# Patient Record
Sex: Male | Born: 1995 | Race: White | Hispanic: No | Marital: Single | Smoking: Former smoker
Health system: Southern US, Community
[De-identification: ages and names within clinical notes are randomized; demographics above are authoritative.]

## PROBLEM LIST (undated history)

## (undated) DIAGNOSIS — J45909 Unspecified asthma, uncomplicated: Secondary | ICD-10-CM

## (undated) DIAGNOSIS — L089 Local infection of the skin and subcutaneous tissue, unspecified: Secondary | ICD-10-CM

## (undated) DIAGNOSIS — J189 Pneumonia, unspecified organism: Secondary | ICD-10-CM

## (undated) DIAGNOSIS — T148XXA Other injury of unspecified body region, initial encounter: Secondary | ICD-10-CM

---

## 2015-11-10 ENCOUNTER — Emergency Department (HOSPITAL_COMMUNITY): Payer: 59

## 2015-11-10 ENCOUNTER — Inpatient Hospital Stay (HOSPITAL_COMMUNITY): Payer: 59

## 2015-11-10 ENCOUNTER — Inpatient Hospital Stay (HOSPITAL_COMMUNITY)
Admission: EM | Admit: 2015-11-10 | Discharge: 2015-11-21 | DRG: 908 | Disposition: A | Discharge status: Home / Self Care | Payer: 59 | Attending: Orthopedic Surgery | Admitting: Orthopedic Surgery

## 2015-11-10 ENCOUNTER — Encounter (HOSPITAL_COMMUNITY): Payer: Self-pay | Admitting: *Deleted

## 2015-11-10 ENCOUNTER — Emergency Department (HOSPITAL_COMMUNITY): Payer: 59 | Admitting: Anesthesiology

## 2015-11-10 ENCOUNTER — Encounter (HOSPITAL_COMMUNITY)
Admission: EM | Disposition: A | Discharge status: Home / Self Care | Payer: Self-pay | Source: Home / Self Care | Attending: Orthopedic Surgery

## 2015-11-10 DIAGNOSIS — S98322A Partial traumatic amputation of left midfoot, initial encounter: Principal | ICD-10-CM | POA: Diagnosis present

## 2015-11-10 DIAGNOSIS — S88112A Complete traumatic amputation at level between knee and ankle, left lower leg, initial encounter: Secondary | ICD-10-CM | POA: Diagnosis present

## 2015-11-10 DIAGNOSIS — F17200 Nicotine dependence, unspecified, uncomplicated: Secondary | ICD-10-CM | POA: Diagnosis present

## 2015-11-10 DIAGNOSIS — J45909 Unspecified asthma, uncomplicated: Secondary | ICD-10-CM | POA: Diagnosis present

## 2015-11-10 DIAGNOSIS — F10929 Alcohol use, unspecified with intoxication, unspecified: Secondary | ICD-10-CM | POA: Diagnosis present

## 2015-11-10 DIAGNOSIS — D62 Acute posthemorrhagic anemia: Secondary | ICD-10-CM | POA: Diagnosis not present

## 2015-11-10 DIAGNOSIS — T1490XA Injury, unspecified, initial encounter: Secondary | ICD-10-CM

## 2015-11-10 DIAGNOSIS — Z89512 Acquired absence of left leg below knee: Secondary | ICD-10-CM | POA: Diagnosis not present

## 2015-11-10 DIAGNOSIS — Y908 Blood alcohol level of 240 mg/100 ml or more: Secondary | ICD-10-CM | POA: Diagnosis present

## 2015-11-10 DIAGNOSIS — G8918 Other acute postprocedural pain: Secondary | ICD-10-CM

## 2015-11-10 DIAGNOSIS — S88112S Complete traumatic amputation at level between knee and ankle, left lower leg, sequela: Secondary | ICD-10-CM | POA: Diagnosis not present

## 2015-11-10 DIAGNOSIS — S98132A Complete traumatic amputation of one left lesser toe, initial encounter: Secondary | ICD-10-CM

## 2015-11-10 DIAGNOSIS — Z89519 Acquired absence of unspecified leg below knee: Secondary | ICD-10-CM | POA: Diagnosis present

## 2015-11-10 DIAGNOSIS — S91302A Unspecified open wound, left foot, initial encounter: Secondary | ICD-10-CM

## 2015-11-10 DIAGNOSIS — Z23 Encounter for immunization: Secondary | ICD-10-CM | POA: Diagnosis not present

## 2015-11-10 DIAGNOSIS — S9782XS Crushing injury of left foot, sequela: Secondary | ICD-10-CM | POA: Diagnosis not present

## 2015-11-10 DIAGNOSIS — R509 Fever, unspecified: Secondary | ICD-10-CM | POA: Diagnosis present

## 2015-11-10 DIAGNOSIS — F419 Anxiety disorder, unspecified: Secondary | ICD-10-CM | POA: Diagnosis not present

## 2015-11-10 DIAGNOSIS — L089 Local infection of the skin and subcutaneous tissue, unspecified: Secondary | ICD-10-CM | POA: Diagnosis present

## 2015-11-10 DIAGNOSIS — S9780XA Crushing injury of unspecified foot, initial encounter: Secondary | ICD-10-CM | POA: Diagnosis present

## 2015-11-10 DIAGNOSIS — S9782XA Crushing injury of left foot, initial encounter: Secondary | ICD-10-CM | POA: Diagnosis present

## 2015-11-10 DIAGNOSIS — F10129 Alcohol abuse with intoxication, unspecified: Secondary | ICD-10-CM | POA: Diagnosis present

## 2015-11-10 DIAGNOSIS — Z419 Encounter for procedure for purposes other than remedying health state, unspecified: Secondary | ICD-10-CM

## 2015-11-10 HISTORY — PX: AMPUTATION: SHX166

## 2015-11-10 HISTORY — PX: I & D EXTREMITY: SHX5045

## 2015-11-10 HISTORY — DX: Unspecified asthma, uncomplicated: J45.909

## 2015-11-10 LAB — RAPID URINE DRUG SCREEN, HOSP PERFORMED
AMPHETAMINES: NOT DETECTED
BARBITURATES: NOT DETECTED
BENZODIAZEPINES: NOT DETECTED
Cocaine: NOT DETECTED
Opiates: POSITIVE — AB
Tetrahydrocannabinol: NOT DETECTED

## 2015-11-10 LAB — COMPREHENSIVE METABOLIC PANEL
ALBUMIN: 4 g/dL (ref 3.5–5.0)
ALK PHOS: 56 U/L (ref 38–126)
ALT: 18 U/L (ref 17–63)
ANION GAP: 8 (ref 5–15)
AST: 25 U/L (ref 15–41)
BILIRUBIN TOTAL: 0.7 mg/dL (ref 0.3–1.2)
BUN: 8 mg/dL (ref 6–20)
CALCIUM: 7.9 mg/dL — AB (ref 8.9–10.3)
CO2: 22 mmol/L (ref 22–32)
Chloride: 111 mmol/L (ref 101–111)
Creatinine, Ser: 0.93 mg/dL (ref 0.61–1.24)
GLUCOSE: 108 mg/dL — AB (ref 65–99)
Potassium: 3.5 mmol/L (ref 3.5–5.1)
Sodium: 141 mmol/L (ref 135–145)
TOTAL PROTEIN: 5.9 g/dL — AB (ref 6.5–8.1)

## 2015-11-10 LAB — CBC
HCT: 40.2 % (ref 39.0–52.0)
HEMATOCRIT: 36.4 % — AB (ref 39.0–52.0)
HEMOGLOBIN: 13.4 g/dL (ref 13.0–17.0)
HEMOGLOBIN: 12 g/dL — AB (ref 13.0–17.0)
MCH: 30 pg (ref 26.0–34.0)
MCH: 29.7 pg (ref 26.0–34.0)
MCHC: 33.3 g/dL (ref 30.0–36.0)
MCHC: 33 g/dL (ref 30.0–36.0)
MCV: 90.1 fL (ref 78.0–100.0)
MCV: 90.1 fL (ref 78.0–100.0)
Platelets: 289 10*3/uL (ref 150–400)
Platelets: 225 10*3/uL (ref 150–400)
RBC: 4.46 MIL/uL (ref 4.22–5.81)
RBC: 4.04 MIL/uL — AB (ref 4.22–5.81)
RDW: 13.3 % (ref 11.5–15.5)
RDW: 13 % (ref 11.5–15.5)
WBC: 10 10*3/uL (ref 4.0–10.5)
WBC: 8.5 10*3/uL (ref 4.0–10.5)

## 2015-11-10 LAB — URINALYSIS, ROUTINE W REFLEX MICROSCOPIC
BILIRUBIN URINE: NEGATIVE
Glucose, UA: NEGATIVE mg/dL
HGB URINE DIPSTICK: NEGATIVE
Ketones, ur: 15 mg/dL — AB
Leukocytes, UA: NEGATIVE
Nitrite: NEGATIVE
PROTEIN: NEGATIVE mg/dL
Specific Gravity, Urine: 1.014 (ref 1.005–1.030)
pH: 7 (ref 5.0–8.0)

## 2015-11-10 LAB — TYPE AND SCREEN
ABO/RH(D): O POS
ANTIBODY SCREEN: NEGATIVE

## 2015-11-10 LAB — ABO/RH: ABO/RH(D): O POS

## 2015-11-10 LAB — PROTIME-INR
INR: 1.16 (ref 0.00–1.49)
PROTHROMBIN TIME: 15 s (ref 11.6–15.2)

## 2015-11-10 LAB — ETHANOL: ALCOHOL ETHYL (B): 278 mg/dL — AB (ref <5)

## 2015-11-10 SURGERY — IRRIGATION AND DEBRIDEMENT EXTREMITY
Anesthesia: General | Site: Foot | Laterality: Left

## 2015-11-10 MED ORDER — OXYCODONE HCL 5 MG PO TABS
5.0000 mg | ORAL_TABLET | ORAL | Status: DC | PRN
  Administered 2015-11-10 – 2015-11-14 (×19): 15 mg via ORAL
  Administered 2015-11-14: 10 mg via ORAL
  Administered 2015-11-14 – 2015-11-15 (×4): 15 mg via ORAL
  Administered 2015-11-15 (×2): 10 mg via ORAL
  Administered 2015-11-15 – 2015-11-16 (×4): 15 mg via ORAL
  Filled 2015-11-10: qty 3
  Filled 2015-11-10: qty 2
  Filled 2015-11-10 (×8): qty 3
  Filled 2015-11-10: qty 2
  Filled 2015-11-10 (×20): qty 3

## 2015-11-10 MED ORDER — METHOCARBAMOL 1000 MG/10ML IJ SOLN: 1000.0000 mg | Freq: Four times a day (QID) | INTRAVENOUS | Status: DC

## 2015-11-10 MED ORDER — HYDROMORPHONE HCL 1 MG/ML IJ SOLN
0.5000 mg | INTRAMUSCULAR | Status: DC | PRN
  Administered 2015-11-10 – 2015-11-13 (×20): 1 mg via INTRAVENOUS
  Filled 2015-11-10 (×21): qty 1

## 2015-11-10 MED ORDER — CEFAZOLIN SODIUM-DEXTROSE 2-3 GM-% IV SOLR
2.0000 g | Freq: Three times a day (TID) | INTRAVENOUS | Status: DC
  Administered 2015-11-10: 2 g via INTRAVENOUS
  Filled 2015-11-10 (×4): qty 50

## 2015-11-10 MED ORDER — CEFAZOLIN SODIUM-DEXTROSE 2-3 GM-% IV SOLR
2.0000 g | Freq: Once | INTRAVENOUS | Status: AC
  Administered 2015-11-10: 2 g via INTRAVENOUS

## 2015-11-10 MED ORDER — PREGABALIN 75 MG PO CAPS
75.0000 mg | ORAL_CAPSULE | Freq: Two times a day (BID) | ORAL | Status: DC
  Administered 2015-11-10 – 2015-11-12 (×4): 75 mg via ORAL
  Filled 2015-11-10 (×5): qty 1

## 2015-11-10 MED ORDER — SUCCINYLCHOLINE 20MG/ML (10ML) SYRINGE FOR MEDFUSION PUMP - OPTIME
INTRAMUSCULAR | Status: DC | PRN
  Administered 2015-11-10: 100 mg via INTRAVENOUS

## 2015-11-10 MED ORDER — SODIUM CHLORIDE 0.9 % IR SOLN
Status: DC | PRN
  Administered 2015-11-10: 3000 mL

## 2015-11-10 MED ORDER — FENTANYL CITRATE (PF) 250 MCG/5ML IJ SOLN
INTRAMUSCULAR | Status: AC
  Filled 2015-11-10: qty 5

## 2015-11-10 MED ORDER — HYDROMORPHONE HCL 1 MG/ML IJ SOLN
0.2500 mg | INTRAMUSCULAR | Status: DC | PRN
  Administered 2015-11-10 (×2): 0.5 mg via INTRAVENOUS

## 2015-11-10 MED ORDER — PROPOFOL 10 MG/ML IV BOLUS
INTRAVENOUS | Status: DC | PRN
  Administered 2015-11-10: 200 mg via INTRAVENOUS

## 2015-11-10 MED ORDER — TETANUS-DIPHTH-ACELL PERTUSSIS 5-2.5-18.5 LF-MCG/0.5 IM SUSP
INTRAMUSCULAR | Status: AC
  Filled 2015-11-10: qty 0.5

## 2015-11-10 MED ORDER — SODIUM CHLORIDE 0.9 % IV SOLN: INTRAVENOUS | Status: DC

## 2015-11-10 MED ORDER — OXYCODONE HCL 5 MG PO TABS
5.0000 mg | ORAL_TABLET | ORAL | Status: DC | PRN
  Administered 2015-11-10 (×2): 5 mg via ORAL
  Filled 2015-11-10 (×2): qty 1

## 2015-11-10 MED ORDER — ENOXAPARIN SODIUM 40 MG/0.4ML ~~LOC~~ SOLN
40.0000 mg | SUBCUTANEOUS | Status: DC
  Administered 2015-11-10 – 2015-11-20 (×11): 40 mg via SUBCUTANEOUS
  Filled 2015-11-10 (×11): qty 0.4

## 2015-11-10 MED ORDER — SODIUM CHLORIDE 0.9 % IV SOLN
INTRAVENOUS | Status: DC
  Administered 2015-11-10 – 2015-11-11 (×5): via INTRAVENOUS
  Administered 2015-11-11: 125 mL/h via INTRAVENOUS

## 2015-11-10 MED ORDER — GENTAMICIN SULFATE 40 MG/ML IJ SOLN
500.0000 mg | INTRAVENOUS | Status: DC
  Administered 2015-11-10 – 2015-11-15 (×5): 500 mg via INTRAVENOUS
  Filled 2015-11-10 (×7): qty 12.5

## 2015-11-10 MED ORDER — DOCUSATE SODIUM 100 MG PO CAPS
100.0000 mg | ORAL_CAPSULE | Freq: Two times a day (BID) | ORAL | Status: DC
  Administered 2015-11-10 – 2015-11-21 (×21): 100 mg via ORAL
  Filled 2015-11-10 (×22): qty 1

## 2015-11-10 MED ORDER — OXYCODONE-ACETAMINOPHEN 5-325 MG PO TABS
1.0000 | ORAL_TABLET | Freq: Four times a day (QID) | ORAL | Status: DC | PRN
  Administered 2015-11-10 – 2015-11-13 (×8): 2 via ORAL
  Filled 2015-11-10 (×8): qty 2

## 2015-11-10 MED ORDER — BISACODYL 5 MG PO TBEC
5.0000 mg | DELAYED_RELEASE_TABLET | Freq: Every day | ORAL | Status: DC | PRN
  Filled 2015-11-10: qty 1

## 2015-11-10 MED ORDER — MORPHINE SULFATE (PF) 2 MG/ML IV SOLN
1.0000 mg | INTRAVENOUS | Status: DC | PRN
  Administered 2015-11-10: 2 mg via INTRAVENOUS
  Filled 2015-11-10: qty 1

## 2015-11-10 MED ORDER — ONDANSETRON HCL 4 MG/2ML IJ SOLN
INTRAMUSCULAR | Status: DC | PRN
  Administered 2015-11-10: 4 mg via INTRAVENOUS

## 2015-11-10 MED ORDER — MORPHINE SULFATE (PF) 2 MG/ML IV SOLN
0.5000 mg | INTRAVENOUS | Status: DC | PRN
  Administered 2015-11-10: 0.5 mg via INTRAVENOUS
  Filled 2015-11-10: qty 1

## 2015-11-10 MED ORDER — HYDROMORPHONE HCL 1 MG/ML IJ SOLN
INTRAMUSCULAR | Status: AC
  Filled 2015-11-10: qty 1

## 2015-11-10 MED ORDER — PANTOPRAZOLE SODIUM 40 MG PO TBEC
40.0000 mg | DELAYED_RELEASE_TABLET | Freq: Every day | ORAL | Status: DC
  Administered 2015-11-10 – 2015-11-21 (×10): 40 mg via ORAL
  Filled 2015-11-10 (×11): qty 1

## 2015-11-10 MED ORDER — ONDANSETRON HCL 4 MG PO TABS
4.0000 mg | ORAL_TABLET | Freq: Four times a day (QID) | ORAL | Status: DC | PRN
  Administered 2015-11-17: 8 mg via ORAL
  Administered 2015-11-18: 4 mg via ORAL
  Filled 2015-11-10: qty 2
  Filled 2015-11-10: qty 1

## 2015-11-10 MED ORDER — CEFAZOLIN SODIUM-DEXTROSE 2-3 GM-% IV SOLR
2.0000 g | Freq: Three times a day (TID) | INTRAVENOUS | Status: AC
  Administered 2015-11-10 – 2015-11-15 (×15): 2 g via INTRAVENOUS
  Filled 2015-11-10 (×19): qty 50

## 2015-11-10 MED ORDER — LIDOCAINE HCL (CARDIAC) 20 MG/ML IV SOLN
INTRAVENOUS | Status: AC
  Filled 2015-11-10: qty 5

## 2015-11-10 MED ORDER — FENTANYL CITRATE (PF) 100 MCG/2ML IJ SOLN
100.0000 ug | Freq: Once | INTRAMUSCULAR | Status: AC
Start: 2015-11-10 — End: 2015-11-10
  Administered 2015-11-10: 100 ug via INTRAVENOUS

## 2015-11-10 MED ORDER — ONDANSETRON HCL 4 MG/2ML IJ SOLN
4.0000 mg | Freq: Once | INTRAMUSCULAR | Status: AC
  Administered 2015-11-10: 4 mg via INTRAVENOUS

## 2015-11-10 MED ORDER — MAGNESIUM HYDROXIDE 400 MG/5ML PO SUSP
30.0000 mL | Freq: Every day | ORAL | Status: DC | PRN
  Administered 2015-11-15 – 2015-11-19 (×3): 30 mL via ORAL
  Filled 2015-11-10 (×3): qty 30

## 2015-11-10 MED ORDER — TETANUS-DIPHTH-ACELL PERTUSSIS 5-2.5-18.5 LF-MCG/0.5 IM SUSP
0.5000 mL | Freq: Once | INTRAMUSCULAR | Status: AC
  Administered 2015-11-10: 0.5 mL via INTRAMUSCULAR

## 2015-11-10 MED ORDER — ONDANSETRON HCL 4 MG/2ML IJ SOLN
4.0000 mg | Freq: Four times a day (QID) | INTRAMUSCULAR | Status: DC | PRN
  Administered 2015-11-10 – 2015-11-13 (×2): 8 mg via INTRAVENOUS
  Filled 2015-11-10: qty 4

## 2015-11-10 MED ORDER — METHOCARBAMOL 500 MG PO TABS
1000.0000 mg | ORAL_TABLET | Freq: Four times a day (QID) | ORAL | Status: DC
  Administered 2015-11-10 – 2015-11-18 (×30): 1000 mg via ORAL
  Filled 2015-11-10 (×32): qty 2

## 2015-11-10 MED ORDER — PROPOFOL 10 MG/ML IV BOLUS
INTRAVENOUS | Status: AC
  Filled 2015-11-10: qty 20

## 2015-11-10 MED ORDER — ONDANSETRON HCL 4 MG/2ML IJ SOLN
INTRAMUSCULAR | Status: AC
  Filled 2015-11-10: qty 4

## 2015-11-10 MED ORDER — LIDOCAINE HCL (CARDIAC) 20 MG/ML IV SOLN
INTRAVENOUS | Status: DC | PRN
  Administered 2015-11-10: 100 mg via INTRAVENOUS

## 2015-11-10 MED ORDER — FLEET ENEMA 7-19 GM/118ML RE ENEM: 1.0000 | ENEMA | Freq: Once | RECTAL | Status: DC | PRN

## 2015-11-10 MED ORDER — LACTATED RINGERS IV SOLN
INTRAVENOUS | Status: DC | PRN
  Administered 2015-11-10: 03:00:00 via INTRAVENOUS

## 2015-11-10 MED ORDER — METHOCARBAMOL 500 MG PO TABS: 500.0000 mg | ORAL_TABLET | Freq: Four times a day (QID) | ORAL | Status: DC | PRN

## 2015-11-10 MED ORDER — PROMETHAZINE HCL 25 MG/ML IJ SOLN: 6.2500 mg | INTRAMUSCULAR | Status: DC | PRN

## 2015-11-10 MED ORDER — SODIUM CHLORIDE 0.9 % IV SOLN
108.9000 mg | INTRAVENOUS | Status: DC | PRN
  Administered 2015-11-10: 80 mg via INTRAVENOUS

## 2015-11-10 MED ORDER — OXYCODONE HCL 5 MG/5ML PO SOLN: 5.0000 mg | Freq: Once | ORAL | Status: DC | PRN

## 2015-11-10 MED ORDER — METHOCARBAMOL 1000 MG/10ML IJ SOLN
500.0000 mg | Freq: Four times a day (QID) | INTRAVENOUS | Status: DC | PRN
  Filled 2015-11-10: qty 5

## 2015-11-10 MED ORDER — OXYCODONE HCL 5 MG PO TABS: 10.0000 mg | ORAL_TABLET | Freq: Once | ORAL | Status: DC | PRN

## 2015-11-10 MED ORDER — OXYCODONE HCL 5 MG PO TABS: 5.0000 mg | ORAL_TABLET | ORAL | Status: DC | PRN

## 2015-11-10 MED ORDER — FENTANYL CITRATE (PF) 100 MCG/2ML IJ SOLN
INTRAMUSCULAR | Status: DC | PRN
  Administered 2015-11-10 (×2): 100 ug via INTRAVENOUS

## 2015-11-10 MED ORDER — SUCCINYLCHOLINE CHLORIDE 20 MG/ML IJ SOLN
INTRAMUSCULAR | Status: AC
  Filled 2015-11-10: qty 1

## 2015-11-10 MED ORDER — METHOCARBAMOL 500 MG PO TABS
1000.0000 mg | ORAL_TABLET | Freq: Four times a day (QID) | ORAL | Status: DC | PRN
  Administered 2015-11-10: 1000 mg via ORAL
  Filled 2015-11-10: qty 2

## 2015-11-10 MED ORDER — METHOCARBAMOL 1000 MG/10ML IJ SOLN
1000.0000 mg | Freq: Four times a day (QID) | INTRAVENOUS | Status: DC | PRN
  Filled 2015-11-10: qty 10

## 2015-11-10 MED ORDER — HYDROCODONE-ACETAMINOPHEN 5-325 MG PO TABS
1.0000 | ORAL_TABLET | Freq: Four times a day (QID) | ORAL | Status: DC | PRN
  Administered 2015-11-10: 2 via ORAL
  Filled 2015-11-10: qty 2

## 2015-11-10 SURGICAL SUPPLY — 52 items
BANDAGE ELASTIC 4 VELCRO ST LF (GAUZE/BANDAGES/DRESSINGS) ×3 IMPLANT
BANDAGE ELASTIC 6 VELCRO ST LF (GAUZE/BANDAGES/DRESSINGS) ×3 IMPLANT
BLADE SURG 10 STRL SS (BLADE) ×6 IMPLANT
BNDG COHESIVE 4X5 TAN STRL (GAUZE/BANDAGES/DRESSINGS) ×3 IMPLANT
BNDG GAUZE ELAST 4 BULKY (GAUZE/BANDAGES/DRESSINGS) ×3 IMPLANT
BNDG GAUZE STRTCH 6 (GAUZE/BANDAGES/DRESSINGS) ×9 IMPLANT
BRUSH SCRUB DISP (MISCELLANEOUS) ×6 IMPLANT
CANISTER WOUND CARE 500ML ATS (WOUND CARE) ×3 IMPLANT
COTTON STERILE ROLL (GAUZE/BANDAGES/DRESSINGS) ×3 IMPLANT
COVER SURGICAL LIGHT HANDLE (MISCELLANEOUS) ×6 IMPLANT
DRAPE U-SHAPE 47X51 STRL (DRAPES) ×3 IMPLANT
DRSG ADAPTIC 3X8 NADH LF (GAUZE/BANDAGES/DRESSINGS) ×3 IMPLANT
DRSG VAC ATS MED SENSATRAC (GAUZE/BANDAGES/DRESSINGS) ×3 IMPLANT
ELECT CAUTERY BLADE 6.4 (BLADE) ×3 IMPLANT
ELECT REM PT RETURN 9FT ADLT (ELECTROSURGICAL)
ELECTRODE REM PT RTRN 9FT ADLT (ELECTROSURGICAL) IMPLANT
GAUZE SPONGE 4X4 12PLY STRL (GAUZE/BANDAGES/DRESSINGS) ×3 IMPLANT
GLOVE BIO SURGEON STRL SZ7 (GLOVE) ×3 IMPLANT
GLOVE BIO SURGEON STRL SZ7.5 (GLOVE) ×9 IMPLANT
GLOVE BIO SURGEON STRL SZ8 (GLOVE) ×3 IMPLANT
GLOVE BIOGEL PI IND STRL 6.5 (GLOVE) ×1 IMPLANT
GLOVE BIOGEL PI IND STRL 7.5 (GLOVE) ×1 IMPLANT
GLOVE BIOGEL PI IND STRL 8 (GLOVE) ×2 IMPLANT
GLOVE BIOGEL PI INDICATOR 6.5 (GLOVE) ×2
GLOVE BIOGEL PI INDICATOR 7.5 (GLOVE) ×2
GLOVE BIOGEL PI INDICATOR 8 (GLOVE) ×4
GOWN STRL REUS W/ TWL LRG LVL3 (GOWN DISPOSABLE) ×2 IMPLANT
GOWN STRL REUS W/ TWL XL LVL3 (GOWN DISPOSABLE) ×1 IMPLANT
GOWN STRL REUS W/TWL LRG LVL3 (GOWN DISPOSABLE) ×4
GOWN STRL REUS W/TWL XL LVL3 (GOWN DISPOSABLE) ×2
HANDPIECE INTERPULSE COAX TIP (DISPOSABLE)
KIT BASIN OR (CUSTOM PROCEDURE TRAY) ×3 IMPLANT
KIT ROOM TURNOVER OR (KITS) ×3 IMPLANT
MANIFOLD NEPTUNE II (INSTRUMENTS) ×3 IMPLANT
NS IRRIG 1000ML POUR BTL (IV SOLUTION) ×3 IMPLANT
PACK ORTHO EXTREMITY (CUSTOM PROCEDURE TRAY) ×3 IMPLANT
PAD ARMBOARD 7.5X6 YLW CONV (MISCELLANEOUS) ×6 IMPLANT
PADDING CAST COTTON 6X4 STRL (CAST SUPPLIES) ×3 IMPLANT
SET HNDPC FAN SPRY TIP SCT (DISPOSABLE) IMPLANT
SPONGE LAP 18X18 X RAY DECT (DISPOSABLE) ×3 IMPLANT
STOCKINETTE IMPERVIOUS 9X36 MD (GAUZE/BANDAGES/DRESSINGS) ×3 IMPLANT
SUT ETHILON 2 0 PSLX (SUTURE) ×3 IMPLANT
SUT ETHILON 3 0 PS 1 (SUTURE) ×3 IMPLANT
SUT PDS AB 2-0 CT1 27 (SUTURE) IMPLANT
TOWEL OR 17X24 6PK STRL BLUE (TOWEL DISPOSABLE) ×3 IMPLANT
TOWEL OR 17X26 10 PK STRL BLUE (TOWEL DISPOSABLE) ×6 IMPLANT
TUBE ANAEROBIC SPECIMEN COL (MISCELLANEOUS) IMPLANT
TUBE CONNECTING 12'X1/4 (SUCTIONS) ×2
TUBE CONNECTING 12X1/4 (SUCTIONS) ×4 IMPLANT
UNDERPAD 30X30 INCONTINENT (UNDERPADS AND DIAPERS) ×3 IMPLANT
WATER STERILE IRR 1000ML POUR (IV SOLUTION) ×3 IMPLANT
YANKAUER SUCT BULB TIP NO VENT (SUCTIONS) ×3 IMPLANT

## 2015-11-10 NOTE — Anesthesia Preprocedure Evaluation (Signed)
Anesthesia Evaluation  Patient identified by MRN, date of birth, ID band Patient awake    Reviewed: Allergy & Precautions, H&P , NPO status , Patient's Chart, lab work & pertinent test results  History of Anesthesia Complications Negative for: history of anesthetic complications  Airway Mallampati: II  TM Distance: >3 FB Neck ROM: full    Dental no notable dental hx. 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,   Endo/Other  negative endocrine ROS  Renal/GU negative Renal ROS     Musculoskeletal   Abdominal   Peds  Hematology negative hematology ROS (+)   Anesthesia Other Findings + alcohol, he is able to answer questions, negative anesthesia history elicited, in C collar, vital signs stable with no difficulty breathing, bilateral breath sounds  Left lower leg wrapped in bandage  Reproductive/Obstetrics negative OB ROS                             Anesthesia Physical Anesthesia Plan  ASA: II and emergent  Anesthesia Plan: General   Post-op Pain Management:    Induction: Intravenous and Rapid sequence  Airway Management Planned: Oral ETT  Additional Equipment:   Intra-op Plan:   Post-operative Plan: Extubation in OR  Informed Consent: I have reviewed the patients History and Physical, chart, labs and discussed the procedure including the risks, benefits and alternatives for the proposed anesthesia with the patient or authorized representative who has indicated his/her understanding and acceptance.   Dental Advisory Given  Plan Discussed with: Anesthesiologist and CRNA  Anesthesia  Plan Comments: (Will provide RSI given trauma situation with unknown reliable last NPO In line stabilization for the neck will be provided given C collar and inability to clear spine due to patient inebriation)        Anesthesia Quick Evaluation
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
## 2015-11-10 NOTE — ED Notes (Signed)
zofran  4mg  and fentanyl 100mcg given iv by kim r n
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
## 2015-11-10 NOTE — ED Notes (Signed)
Aspen collar placed.  Pt going to the or as soon as his c-ts are done

## 2015-11-10 NOTE — Progress Notes (Signed)
   11/10/15 0123  Clinical Encounter Type  Visited With Patient  Visit Type ED  Referral From Nurse  Spiritual Encounters  Spiritual Needs Emotional  Stress Factors  Patient Stress Factors Health changes;Lack of knowledge;Family relationships  Advance Directives (For Healthcare)  Does patient have an advance directive? No  Would patient like information on creating an advanced directive? No - patient declined information  Paged by ED for Tr Lev 1, downgraded to 2. Visited with patient, spoke to doctor. Doctor had talked to father in [place], and he was on his way. Large group of friends from [employer] in waiting room, one of whom tried to convince chaplain he was patient's brother. Chaplain confirmed he had no brother at [employer]. Chaplain advised friends (and Diplomatic Services operational officersecretary) that they could not come back. Chaplain spoke briefly to patient to offer encouragement before he went for testing. Instructed ED staff to page me if parents arrived.
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
## 2015-11-10 NOTE — Anesthesia Postprocedure Evaluation (Signed)
Anesthesia Post Note  Patient: Darius [name]  Procedure(s) Performed: Procedure(s) (LRB): IRRIGATION AND DEBRIDEMENT EXTREMITY (Left) revision of amputation left foot (Left)  Patient location during evaluation: PACU Anesthesia Type: General Level of consciousness: awake and alert Pain management: pain level controlled Vital Signs Assessment: post-procedure vital signs reviewed and stable Respiratory status: spontaneous breathing, nonlabored ventilation, respiratory function stable and patient connected to nasal cannula oxygen Cardiovascular status: blood pressure returned to baseline and stable Postop Assessment: no signs of nausea or vomiting Anesthetic complications: no    Last Vitals:  Filed Vitals:   11/10/15 0530 11/10/15 0537  BP: 156/100   Pulse: 115 108  Temp:  36.9 C  Resp: 26 16    Last Pain:  Filed Vitals:   11/10/15 0538  PainSc: Darius [name]                 Arriah Wadle J

## 2015-11-10 NOTE — ED Notes (Signed)
The pt was not seen by dr Dwain [place] or dr handy.  However dr handy is ready in the or now and just called doen

## 2015-11-10 NOTE — ED Notes (Signed)
Port lft foot  And chest

## 2015-11-10 NOTE — Anesthesia Procedure Notes (Signed)
Procedure Name: Intubation Date/Time: 11/10/2015 2:30 AM Performed by: Molli HazardGORDON, Milagro Belmares M Pre-anesthesia Checklist: Patient identified, Emergency Drugs available, Suction available and Patient being monitored Patient Re-evaluated:Patient Re-evaluated prior to inductionOxygen Delivery Method: Circle system utilized Preoxygenation: Pre-oxygenation with 100% oxygen Intubation Type: IV induction, Rapid sequence and Cricoid Pressure applied Laryngoscope Size: Miller and 2 Grade View: Grade I Tube type: Oral Tube size: 7.5 mm Number of attempts: 1 Airway Equipment and Method: Stylet Placement Confirmation: ETT inserted through vocal cords under direct vision,  positive ETCO2 and breath sounds checked- equal and bilateral Secured at: 23 cm Tube secured with: Tape Dental Injury: Teeth and Oropharynx as per pre-operative assessment  Comments: Front of cervical collar removed for induction; head held neutral throughout induction and intubation; cervical collar replaced.

## 2015-11-10 NOTE — Transfer of Care (Signed)
Immediate Anesthesia Transfer of Care Note  Patient: Darius [name]  Procedure(s) Performed: Procedure(s): IRRIGATION AND DEBRIDEMENT EXTREMITY (Left) revision of amputation left foot (Left)  Patient Location: PACU  Anesthesia Type:General  Level of Consciousness: sedated, patient cooperative and responds to stimulation  Airway & Oxygen Therapy: Patient connected to nasal cannula oxygen  Post-op Assessment: Report given to RN and Post -op Vital signs reviewed and stable  Post vital signs: Reviewed and stable  Last Vitals:  Filed Vitals:   11/10/15 0124 11/10/15 0132  BP: 133/90 148/111  Pulse: 61 107  Temp:    Resp: 16 20    Complications: No apparent anesthesia complications

## 2015-11-10 NOTE — Progress Notes (Signed)
Last review completed on  pt discharged .  Review completed within 72 hours of discharge, no additional review needed  
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
## 2015-11-10 NOTE — ED Notes (Signed)
To the or
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
## 2015-11-10 NOTE — Progress Notes (Signed)
PT Cancellation Note  Patient Details Name: Darius [name] A Lick MRN: [id number] DOB: 08/29/1996   Cancelled Treatment:    Reason Eval/Treat Not Completed: Pain limiting ability to participate Declines to get OOB 2/2 pain, despite nursing maximizing pain medications. Agreeable to get OOB tomorrow with physical therapist and work on safe mobility using crutches.  Berton MountBarbour, Nashaly Dorantes S 11/10/2015, 4:12 PM Sunday SpillersLogan Secor Dun[place]bour, [place] [phone]

## 2015-11-10 NOTE — ED Notes (Addendum)
Wallet black with 155.00  With credit cards and cell phone  Placed in the  Security office  Black wallet with 154.00 dollars with drivers license   Credit cards  And cell pjhone placed in the security office

## 2015-11-10 NOTE — ED Notes (Signed)
sats dropping after the fentanyl given nasal o2 at 2 applied to keep his sats up

## 2015-11-10 NOTE — ED Notes (Signed)
It called a level 1 initially  Downgraded to level 2 within  Minutes of arrival

## 2015-11-11 ENCOUNTER — Inpatient Hospital Stay (HOSPITAL_COMMUNITY): Payer: 59 | Admitting: Anesthesiology

## 2015-11-11 ENCOUNTER — Encounter (HOSPITAL_COMMUNITY)
Admission: EM | Disposition: A | Discharge status: Home / Self Care | Payer: Self-pay | Source: Home / Self Care | Attending: Orthopedic Surgery

## 2015-11-11 ENCOUNTER — Encounter (HOSPITAL_COMMUNITY): Payer: Self-pay | Admitting: Certified Registered Nurse Anesthetist

## 2015-11-11 HISTORY — PX: I & D EXTREMITY: SHX5045

## 2015-11-11 LAB — SURGICAL PCR SCREEN
MRSA, PCR: NEGATIVE
STAPHYLOCOCCUS AUREUS: NEGATIVE

## 2015-11-11 LAB — GENTAMICIN LEVEL, RANDOM: Gentamicin Rm: 0.7 ug/mL

## 2015-11-11 SURGERY — IRRIGATION AND DEBRIDEMENT EXTREMITY
Anesthesia: General | Laterality: Right

## 2015-11-11 MED ORDER — LACTATED RINGERS IV SOLN
INTRAVENOUS | Status: DC | PRN
  Administered 2015-11-11 (×2): via INTRAVENOUS

## 2015-11-11 MED ORDER — LIDOCAINE HCL (CARDIAC) 20 MG/ML IV SOLN
INTRAVENOUS | Status: DC | PRN
  Administered 2015-11-11: 100 mg via INTRAVENOUS

## 2015-11-11 MED ORDER — FENTANYL CITRATE (PF) 250 MCG/5ML IJ SOLN
INTRAMUSCULAR | Status: AC
  Filled 2015-11-11: qty 5

## 2015-11-11 MED ORDER — HYDROMORPHONE HCL 1 MG/ML IJ SOLN: 0.5000 mg | INTRAMUSCULAR | Status: DC | PRN

## 2015-11-11 MED ORDER — GLYCOPYRROLATE 0.2 MG/ML IJ SOLN
INTRAMUSCULAR | Status: DC | PRN
  Administered 2015-11-11: 0.1 mg via INTRAVENOUS

## 2015-11-11 MED ORDER — OXYCODONE HCL 5 MG PO TABS: 15.0000 mg | ORAL_TABLET | Freq: Once | ORAL | Status: DC | PRN

## 2015-11-11 MED ORDER — LIDOCAINE HCL (CARDIAC) 20 MG/ML IV SOLN
INTRAVENOUS | Status: AC
  Filled 2015-11-11: qty 5

## 2015-11-11 MED ORDER — SODIUM CHLORIDE 0.9 % IR SOLN
Status: DC | PRN
  Administered 2015-11-11: 3000 mL

## 2015-11-11 MED ORDER — OXYCODONE HCL 5 MG/5ML PO SOLN: 5.0000 mg | Freq: Once | ORAL | Status: DC | PRN

## 2015-11-11 MED ORDER — MIDAZOLAM HCL 2 MG/2ML IJ SOLN
INTRAMUSCULAR | Status: AC
  Filled 2015-11-11: qty 2

## 2015-11-11 MED ORDER — PROPOFOL 10 MG/ML IV BOLUS
INTRAVENOUS | Status: AC
  Filled 2015-11-11: qty 40

## 2015-11-11 MED ORDER — PROMETHAZINE HCL 25 MG/ML IJ SOLN: 6.2500 mg | INTRAMUSCULAR | Status: DC | PRN

## 2015-11-11 MED ORDER — ONDANSETRON HCL 4 MG/2ML IJ SOLN
INTRAMUSCULAR | Status: AC
  Filled 2015-11-11: qty 2

## 2015-11-11 MED ORDER — MIDAZOLAM HCL 5 MG/5ML IJ SOLN
INTRAMUSCULAR | Status: DC | PRN
  Administered 2015-11-11: 2 mg via INTRAVENOUS

## 2015-11-11 MED ORDER — PHENYLEPHRINE 40 MCG/ML (10ML) SYRINGE FOR IV PUSH (FOR BLOOD PRESSURE SUPPORT)
PREFILLED_SYRINGE | INTRAVENOUS | Status: AC
  Filled 2015-11-11: qty 10

## 2015-11-11 MED ORDER — PHENYLEPHRINE HCL 10 MG/ML IJ SOLN
INTRAMUSCULAR | Status: DC | PRN
  Administered 2015-11-11: 120 ug via INTRAVENOUS
  Administered 2015-11-11: 80 ug via INTRAVENOUS

## 2015-11-11 MED ORDER — PROPOFOL 10 MG/ML IV BOLUS
INTRAVENOUS | Status: DC | PRN
  Administered 2015-11-11: 200 mg via INTRAVENOUS

## 2015-11-11 MED ORDER — FENTANYL CITRATE (PF) 100 MCG/2ML IJ SOLN
INTRAMUSCULAR | Status: DC | PRN
  Administered 2015-11-11: 50 ug via INTRAVENOUS

## 2015-11-11 MED ORDER — ONDANSETRON HCL 4 MG/2ML IJ SOLN
INTRAMUSCULAR | Status: DC | PRN
Start: 2015-11-11 — End: 2015-11-11
  Administered 2015-11-11: 4 mg via INTRAVENOUS

## 2015-11-11 SURGICAL SUPPLY — 46 items
BANDAGE ELASTIC 4 VELCRO ST LF (GAUZE/BANDAGES/DRESSINGS) ×2 IMPLANT
BANDAGE ELASTIC 6 VELCRO ST LF (GAUZE/BANDAGES/DRESSINGS) ×2 IMPLANT
BLADE SURG 10 STRL SS (BLADE) ×2 IMPLANT
BNDG COHESIVE 4X5 TAN STRL (GAUZE/BANDAGES/DRESSINGS) ×2 IMPLANT
BNDG GAUZE ELAST 4 BULKY (GAUZE/BANDAGES/DRESSINGS) ×4 IMPLANT
BNDG GAUZE STRTCH 6 (GAUZE/BANDAGES/DRESSINGS) ×6 IMPLANT
BRUSH SCRUB DISP (MISCELLANEOUS) ×4 IMPLANT
CANISTER WOUND CARE 500ML ATS (WOUND CARE) ×2 IMPLANT
COTTONBALL LRG STERILE PKG (GAUZE/BANDAGES/DRESSINGS) ×2 IMPLANT
COVER SURGICAL LIGHT HANDLE (MISCELLANEOUS) ×4 IMPLANT
DRAPE U-SHAPE 47X51 STRL (DRAPES) ×2 IMPLANT
DRSG ADAPTIC 3X8 NADH LF (GAUZE/BANDAGES/DRESSINGS) ×2 IMPLANT
DRSG VAC ATS MED SENSATRAC (GAUZE/BANDAGES/DRESSINGS) ×2 IMPLANT
ELECT CAUTERY BLADE 6.4 (BLADE) IMPLANT
ELECT REM PT RETURN 9FT ADLT (ELECTROSURGICAL)
ELECTRODE REM PT RTRN 9FT ADLT (ELECTROSURGICAL) IMPLANT
GAUZE SPONGE 4X4 12PLY STRL (GAUZE/BANDAGES/DRESSINGS) ×2 IMPLANT
GLOVE BIO SURGEON STRL SZ7.5 (GLOVE) ×2 IMPLANT
GLOVE BIO SURGEON STRL SZ8 (GLOVE) ×2 IMPLANT
GLOVE BIOGEL PI IND STRL 7.5 (GLOVE) ×1 IMPLANT
GLOVE BIOGEL PI IND STRL 8 (GLOVE) ×1 IMPLANT
GLOVE BIOGEL PI INDICATOR 7.5 (GLOVE) ×1
GLOVE BIOGEL PI INDICATOR 8 (GLOVE) ×1
GOWN STRL REUS W/ TWL LRG LVL3 (GOWN DISPOSABLE) ×2 IMPLANT
GOWN STRL REUS W/ TWL XL LVL3 (GOWN DISPOSABLE) ×1 IMPLANT
GOWN STRL REUS W/TWL LRG LVL3 (GOWN DISPOSABLE) ×2
GOWN STRL REUS W/TWL XL LVL3 (GOWN DISPOSABLE) ×1
HANDPIECE INTERPULSE COAX TIP (DISPOSABLE)
KIT BASIN OR (CUSTOM PROCEDURE TRAY) ×2 IMPLANT
KIT ROOM TURNOVER OR (KITS) ×2 IMPLANT
MANIFOLD NEPTUNE II (INSTRUMENTS) ×2 IMPLANT
NS IRRIG 1000ML POUR BTL (IV SOLUTION) ×2 IMPLANT
PACK ORTHO EXTREMITY (CUSTOM PROCEDURE TRAY) ×2 IMPLANT
PAD ARMBOARD 7.5X6 YLW CONV (MISCELLANEOUS) ×4 IMPLANT
PADDING CAST COTTON 6X4 STRL (CAST SUPPLIES) ×2 IMPLANT
SET HNDPC FAN SPRY TIP SCT (DISPOSABLE) IMPLANT
SPONGE LAP 18X18 X RAY DECT (DISPOSABLE) ×2 IMPLANT
STOCKINETTE IMPERVIOUS 9X36 MD (GAUZE/BANDAGES/DRESSINGS) ×2 IMPLANT
SUT PDS AB 2-0 CT1 27 (SUTURE) IMPLANT
TOWEL OR 17X24 6PK STRL BLUE (TOWEL DISPOSABLE) ×2 IMPLANT
TOWEL OR 17X26 10 PK STRL BLUE (TOWEL DISPOSABLE) ×4 IMPLANT
TUBE ANAEROBIC SPECIMEN COL (MISCELLANEOUS) IMPLANT
TUBE CONNECTING 12X1/4 (SUCTIONS) ×2 IMPLANT
UNDERPAD 30X30 INCONTINENT (UNDERPADS AND DIAPERS) ×2 IMPLANT
WATER STERILE IRR 1000ML POUR (IV SOLUTION) ×2 IMPLANT
YANKAUER SUCT BULB TIP NO VENT (SUCTIONS) ×2 IMPLANT

## 2015-11-11 NOTE — Brief Op Note (Signed)
11/10/2015 - 11/11/2015  11:41 AM  PATIENT:  Josephina Gipyan A Koeller  19 y.o. male  PRE-OPERATIVE DIAGNOSIS:  left foot crush   POST-OPERATIVE DIAGNOSIS:  left foot crush   PROCEDURE:  Procedure(s): 1. IRRIGATION AND DEBRIDEMENT EXTREMITY (Right) Grade 3 open fractures skin, SQ, muscle and tendon 2. Application of medium wound vac  SURGEON:  Surgeon(s) and Role:    * Myrene GalasMichael Elfida Shimada, MD - Primary  PHYSICIAN ASSISTANT: Montez MoritaKeith Paul, PA-C  ANESTHESIA:   general  I/O:     SPECIMEN:  No Specimen  TOURNIQUET:  * No tourniquets in log *  DICTATION: .Other Dictation: Dictation Number [id number]

## 2015-11-11 NOTE — Anesthesia Procedure Notes (Signed)
Procedure Name: LMA Insertion Performed by: Everlene BallsHAYES, Elimelech Houseman T Pre-anesthesia Checklist: Patient identified, Timeout performed, Emergency Drugs available, Suction available and Patient being monitored Patient Re-evaluated:Patient Re-evaluated prior to inductionOxygen Delivery Method: Circle system utilized Preoxygenation: Pre-oxygenation with 100% oxygen Intubation Type: IV induction Ventilation: Mask ventilation without difficulty LMA: LMA inserted LMA Size: 5.0 Number of attempts: 1 Placement Confirmation: breath sounds checked- equal and bilateral and positive ETCO2 Tube secured with: Tape Dental Injury: Teeth and Oropharynx as per pre-operative assessment

## 2015-11-11 NOTE — Anesthesia Postprocedure Evaluation (Signed)
Anesthesia Post Note  Patient: Darius [name]  Procedure(s) Performed: Procedure(s) (LRB): IRRIGATION AND DEBRIDEMENT EXTREMITY (Right)  Patient location during evaluation: PACU Anesthesia Type: General Level of consciousness: awake and alert Pain management: pain level controlled Vital Signs Assessment: post-procedure vital signs reviewed and stable Respiratory status: spontaneous breathing, nonlabored ventilation, respiratory function stable and patient connected to nasal cannula oxygen Cardiovascular status: blood pressure returned to baseline and stable Postop Assessment: no signs of nausea or vomiting Anesthetic complications: no    Last Vitals:  Filed Vitals:   11/11/15 1200 11/11/15 1214  BP: 151/94 150/97  Pulse: 99 103  Temp:    Resp: 12 12    Last Pain:  Filed Vitals:   11/11/15 1224  PainSc: 8                  Reino KentJudd, Marykatherine Sherwood J

## 2015-11-11 NOTE — Progress Notes (Signed)
PT Cancellation Note  Patient Details Name: Darius GipRyan A [name] MRN: [id number] DOB: 06/24/1996   Cancelled Treatment:    Reason Eval/Treat Not Completed: Patient at procedure or test/unavailable Patient has been taken to OR for I&D. Will continue to follow for PT evaluation as schedule allows.  Berton MountBarbour, Marisal Swarey S 11/11/2015, 10:11 AM Charlsie MerlesLogan Secor Anjel Pardo, PT [phone]

## 2015-11-11 NOTE — Transfer of Care (Signed)
Immediate Anesthesia Transfer of Care Note  Patient: Darius [name]  Procedure(s) Performed: Procedure(s): IRRIGATION AND DEBRIDEMENT EXTREMITY (Right)  Patient Location: PACU  Anesthesia Type:General  Level of Consciousness: patient cooperative and responds to stimulation  Airway & Oxygen Therapy: Patient Spontanous Breathing and Patient connected to nasal cannula oxygen  Post-op Assessment: Report given to RN and Post -op Vital signs reviewed and stable  Post vital signs: Reviewed and stable  Last Vitals:  Filed Vitals:   11/11/15 0517 11/11/15 0931  BP: 115/59 130/47  Pulse: 84 100  Temp: 37.6 C 37.3 C  Resp: 16 18    Complications: No apparent anesthesia complications
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
## 2015-11-11 NOTE — Anesthesia Preprocedure Evaluation (Signed)
Anesthesia Evaluation  Patient identified by MRN, date of birth, ID band Patient awake    Reviewed: Allergy & Precautions, H&P , NPO status , Patient's Chart, lab work & pertinent test results  History of Anesthesia Complications Negative for: history of anesthetic complications  Airway Mallampati: I  TM Distance: >3 FB Neck ROM: full    Dental no notable dental hx. 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,   Endo/Other  negative endocrine ROS  Renal/GU negative Renal ROS     Musculoskeletal   Abdominal   Peds  Hematology negative hematology ROS (+)   Anesthesia Other Findings   Reproductive/Obstetrics negative OB ROS                             Anesthesia Physical  Anesthesia Plan  ASA: II  Anesthesia Plan: General   Post-op Pain Management:    Induction: Intravenous  Airway Management Planned: LMA  Additional Equipment:   Intra-op Plan:   Post-operative Plan: Extubation in OR  Informed Consent: I have reviewed the patients History and Physical, chart, labs and discussed the procedure including the risks, benefits and alternatives for the proposed anesthesia with the patient or authorized representative who has indicated his/her understanding and acceptance.   Dental Advisory Given  Plan Discussed with: Anesthesiologist and CRNA  Anesthesia Plan Comments: (Trauma from 40 hours previous here for I and D of tissue of left lower leg)        Anesthesia Quick Evaluation

## 2015-11-11 NOTE — Progress Notes (Signed)
OT Cancellation Note  Patient Details Name: Darius [name] MRN: [id number] DOB: 05/10/1996   Cancelled Treatment:    Reason Eval/Treat Not Completed: Other (comment) (Plan for return to OR on 12/12). Will follow up for OT eval post op.   Gaye AlkenBailey A Caliyah Sieh M.S., OTR/L Pager: [phone]  11/11/2015, 8:10 AM
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
## 2015-11-12 ENCOUNTER — Encounter (HOSPITAL_COMMUNITY): Payer: Self-pay | Admitting: Orthopedic Surgery

## 2015-11-12 MED ORDER — DIPHENHYDRAMINE HCL 25 MG PO CAPS
25.0000 mg | ORAL_CAPSULE | Freq: Four times a day (QID) | ORAL | Status: DC | PRN
  Administered 2015-11-12 (×2): 25 mg via ORAL
  Filled 2015-11-12 (×2): qty 1

## 2015-11-12 MED ORDER — PREGABALIN 75 MG PO CAPS
75.0000 mg | ORAL_CAPSULE | Freq: Three times a day (TID) | ORAL | Status: DC
  Administered 2015-11-12 – 2015-11-13 (×4): 75 mg via ORAL
  Filled 2015-11-12 (×4): qty 1

## 2015-11-12 NOTE — Progress Notes (Signed)
ANTIBIOTIC CONSULT NOTE - FOLLOW UP  Pharmacy consult for Gentamycin Indication: Traumatic L midfoot amputation  (open wound)  No Known Allergies  Patient Measurements: Height: 6\' 1"  (185.4 cm) Weight: 159 lb 13.3 oz (72.5 kg) IBW/kg (Calculated) : 79.9  Vital Signs: Temp: 98.6 F (37 C) (12/12 1300) Temp Source: Oral (12/12 1300) BP: 121/58 mmHg (12/12 1300) Pulse Rate: 97 (12/12 1300)   Intake/Output from previous day: 12/11 0701 - 12/12 0700 In: 2225 [I.V.:2225] Out: 2200 [Urine:2000; Drains:200]   Intake/Output from this shift: Total I/O In: 480 [P.O.:480] Out: 750 [Urine:750]  Labs:  Recent Labs  11/10/15 0104 11/10/15 2250  WBC 10.0 8.5  HGB 13.4 12.0*  PLT 289 225  CREATININE 0.93  --   Estimated CrCl: > 100 ml/min     Microbiology: Anti-infective's  12/10 Cefazolin >> 12/10 Gentamycin 7 mg/kg (Ext interval) >>  Cx data:  12/11 MRSA by PCR: negative   Pertinent labs/levels drawn this admission: 12/10 gentamycin random: 0.7 drawn ~ 10 hours 500 mg dose and SCr 0.93  Assessment: 19 yoM admitted with L foot traumatic crush injury with open wound and concern poly microbial contamination/infection. S/p OR on  12/10. Planning for OR again on 12/13 for I&D and possible amputation.  Goal of Therapy:  Gentamycin level < 0.5 if drawn at 24 hours   Plan:  1. Continue gentamycin at current dose of 500 mg (~ 7 mg/kg) Q24H as random level obtained on 12/10 was within desirable range  2. BMP in am to ensure renal fxn at baseline (last on was on 12/12) 3. Ancef 2gms IV q8H 4. Monitor surgical plans, any potential cx data and adjust abx and length of therapy as needed 5. Following along with you daily  Pollyann SamplesAndy Verlee Pope, PharmD, BCPS 11/12/2015, 2:04 PM Pager: [phone]

## 2015-11-12 NOTE — Progress Notes (Signed)
   11/12/15 1400  Clinical Encounter Type  Visited With Patient and family together  Visit Type Follow-up  Spiritual Encounters  Spiritual Needs Emotional  Stress Factors  Patient Stress Factors Health changes;Major life changes  Family Stress Factors Health changes  Chaplain followed up on patient who came in to ED on her shift. Ensured that parents had made it from [place]. Patient still not responsive to chaplain, answers in monosyllables; however, family seemed appreciative of visit.
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
## 2015-11-13 ENCOUNTER — Inpatient Hospital Stay (HOSPITAL_COMMUNITY): Payer: 59 | Admitting: Anesthesiology

## 2015-11-13 ENCOUNTER — Inpatient Hospital Stay (HOSPITAL_COMMUNITY): Payer: 59

## 2015-11-13 ENCOUNTER — Encounter (HOSPITAL_COMMUNITY)
Admission: EM | Disposition: A | Discharge status: Home / Self Care | Payer: Self-pay | Source: Home / Self Care | Attending: Orthopedic Surgery

## 2015-11-13 ENCOUNTER — Encounter (HOSPITAL_COMMUNITY): Payer: Self-pay | Admitting: Certified Registered Nurse Anesthetist

## 2015-11-13 HISTORY — PX: AMPUTATION: SHX166

## 2015-11-13 LAB — COMPREHENSIVE METABOLIC PANEL
ALT: 18 U/L (ref 17–63)
ANION GAP: 5 (ref 5–15)
AST: 75 U/L — ABNORMAL HIGH (ref 15–41)
Albumin: 2.9 g/dL — ABNORMAL LOW (ref 3.5–5.0)
Alkaline Phosphatase: 44 U/L (ref 38–126)
BUN: <5 mg/dL — ABNORMAL LOW (ref 6–20)
CALCIUM: 8.6 mg/dL — AB (ref 8.9–10.3)
CHLORIDE: 100 mmol/L — AB (ref 101–111)
CO2: 33 mmol/L — AB (ref 22–32)
CREATININE: 0.92 mg/dL (ref 0.61–1.24)
Glucose, Bld: 100 mg/dL — ABNORMAL HIGH (ref 65–99)
Potassium: 4.6 mmol/L (ref 3.5–5.1)
SODIUM: 138 mmol/L (ref 135–145)
Total Bilirubin: 0.5 mg/dL (ref 0.3–1.2)
Total Protein: 5.3 g/dL — ABNORMAL LOW (ref 6.5–8.1)

## 2015-11-13 LAB — CBC
HCT: 28.4 % — ABNORMAL LOW (ref 39.0–52.0)
Hemoglobin: 9.2 g/dL — ABNORMAL LOW (ref 13.0–17.0)
MCH: 29.5 pg (ref 26.0–34.0)
MCHC: 32.4 g/dL (ref 30.0–36.0)
MCV: 91 fL (ref 78.0–100.0)
PLATELETS: 210 10*3/uL (ref 150–400)
RBC: 3.12 MIL/uL — AB (ref 4.22–5.81)
RDW: 13 % (ref 11.5–15.5)
WBC: 4.8 10*3/uL (ref 4.0–10.5)

## 2015-11-13 SURGERY — AMPUTATION, FOOT, PARTIAL
Anesthesia: Regional | Site: Leg Lower | Laterality: Left

## 2015-11-13 MED ORDER — FENTANYL CITRATE (PF) 250 MCG/5ML IJ SOLN
INTRAMUSCULAR | Status: AC
  Filled 2015-11-13: qty 5

## 2015-11-13 MED ORDER — ENOXAPARIN SODIUM 40 MG/0.4ML ~~LOC~~ SOLN: 40.0000 mg | SUBCUTANEOUS | Status: DC

## 2015-11-13 MED ORDER — HYDROMORPHONE 1 MG/ML IV SOLN
INTRAVENOUS | Status: DC
  Administered 2015-11-13: 13:00:00 via INTRAVENOUS

## 2015-11-13 MED ORDER — SODIUM CHLORIDE 0.9 % IJ SOLN
INTRAMUSCULAR | Status: AC
Start: 2015-11-13 — End: 2015-11-13
  Filled 2015-11-13: qty 10

## 2015-11-13 MED ORDER — SUCCINYLCHOLINE CHLORIDE 20 MG/ML IJ SOLN
INTRAMUSCULAR | Status: AC
  Filled 2015-11-13: qty 1

## 2015-11-13 MED ORDER — ONDANSETRON HCL 4 MG/2ML IJ SOLN
INTRAMUSCULAR | Status: DC | PRN
  Administered 2015-11-13: 4 mg via INTRAVENOUS

## 2015-11-13 MED ORDER — BUPIVACAINE HCL (PF) 0.5 % IJ SOLN
INTRAMUSCULAR | Status: DC | PRN
  Administered 2015-11-13: 10 mL via PERINEURAL

## 2015-11-13 MED ORDER — OXYCODONE-ACETAMINOPHEN 7.5-325 MG PO TABS
1.0000 | ORAL_TABLET | Freq: Four times a day (QID) | ORAL | Status: DC | PRN
  Administered 2015-11-13 – 2015-11-14 (×3): 2 via ORAL
  Filled 2015-11-13 (×3): qty 2

## 2015-11-13 MED ORDER — MIDAZOLAM HCL 2 MG/2ML IJ SOLN
INTRAMUSCULAR | Status: AC
  Filled 2015-11-13: qty 2

## 2015-11-13 MED ORDER — METOCLOPRAMIDE HCL 5 MG/ML IJ SOLN
INTRAMUSCULAR | Status: AC
Start: 2015-11-13 — End: 2015-11-13
  Filled 2015-11-13: qty 2

## 2015-11-13 MED ORDER — LACTATED RINGERS IV SOLN
INTRAVENOUS | Status: DC | PRN
  Administered 2015-11-13 (×2): via INTRAVENOUS

## 2015-11-13 MED ORDER — DIPHENHYDRAMINE HCL 12.5 MG/5ML PO ELIX: 12.5000 mg | ORAL_SOLUTION | Freq: Four times a day (QID) | ORAL | Status: DC | PRN

## 2015-11-13 MED ORDER — PROPOFOL 10 MG/ML IV BOLUS
INTRAVENOUS | Status: DC | PRN
  Administered 2015-11-13: 200 mg via INTRAVENOUS

## 2015-11-13 MED ORDER — METOCLOPRAMIDE HCL 5 MG/ML IJ SOLN
INTRAMUSCULAR | Status: DC | PRN
  Administered 2015-11-13: 10 mg via INTRAVENOUS

## 2015-11-13 MED ORDER — HYDROMORPHONE HCL 1 MG/ML IJ SOLN: 0.2500 mg | INTRAMUSCULAR | Status: DC | PRN

## 2015-11-13 MED ORDER — ROCURONIUM BROMIDE 50 MG/5ML IV SOLN
INTRAVENOUS | Status: AC
  Filled 2015-11-13: qty 1

## 2015-11-13 MED ORDER — CEFAZOLIN SODIUM-DEXTROSE 2-3 GM-% IV SOLR
INTRAVENOUS | Status: AC
  Filled 2015-11-13: qty 50

## 2015-11-13 MED ORDER — MIDAZOLAM HCL 5 MG/5ML IJ SOLN
INTRAMUSCULAR | Status: DC | PRN
  Administered 2015-11-13: 2 mg via INTRAVENOUS

## 2015-11-13 MED ORDER — FENTANYL CITRATE (PF) 100 MCG/2ML IJ SOLN
INTRAMUSCULAR | Status: DC | PRN
  Administered 2015-11-13 (×2): 50 ug via INTRAVENOUS
  Administered 2015-11-13 (×2): 25 ug via INTRAVENOUS
  Administered 2015-11-13: 50 ug via INTRAVENOUS

## 2015-11-13 MED ORDER — PROPOFOL 10 MG/ML IV BOLUS
INTRAVENOUS | Status: AC
  Filled 2015-11-13: qty 20

## 2015-11-13 MED ORDER — DIPHENHYDRAMINE HCL 50 MG/ML IJ SOLN: 12.5000 mg | Freq: Four times a day (QID) | INTRAMUSCULAR | Status: DC | PRN

## 2015-11-13 MED ORDER — ARTIFICIAL TEARS OP OINT
TOPICAL_OINTMENT | OPHTHALMIC | Status: AC
  Filled 2015-11-13: qty 3.5

## 2015-11-13 MED ORDER — HYDROMORPHONE HCL 1 MG/ML IJ SOLN
1.0000 mg | INTRAMUSCULAR | Status: DC | PRN
  Administered 2015-11-13 – 2015-11-14 (×3): 1 mg via INTRAVENOUS
  Administered 2015-11-14 – 2015-11-15 (×8): 2 mg via INTRAVENOUS
  Filled 2015-11-13 (×7): qty 2
  Filled 2015-11-13: qty 1
  Filled 2015-11-13: qty 2
  Filled 2015-11-13: qty 1
  Filled 2015-11-13: qty 2

## 2015-11-13 MED ORDER — LIDOCAINE HCL (CARDIAC) 10 MG/ML IV SOLN
INTRAVENOUS | Status: DC | PRN
  Administered 2015-11-13: 50 mg via INTRAVENOUS

## 2015-11-13 MED ORDER — BUPIVACAINE-EPINEPHRINE (PF) 0.5% -1:200000 IJ SOLN
INTRAMUSCULAR | Status: DC | PRN
  Administered 2015-11-13: 50 mL via PERINEURAL

## 2015-11-13 MED ORDER — SODIUM CHLORIDE 0.9 % IJ SOLN: 9.0000 mL | INTRAMUSCULAR | Status: DC | PRN

## 2015-11-13 MED ORDER — BUPIVACAINE-EPINEPHRINE (PF) 0.25% -1:200000 IJ SOLN
INTRAMUSCULAR | Status: AC
  Filled 2015-11-13: qty 30

## 2015-11-13 MED ORDER — NALOXONE HCL 0.4 MG/ML IJ SOLN: 0.4000 mg | INTRAMUSCULAR | Status: DC | PRN

## 2015-11-13 MED ORDER — MEPERIDINE HCL 25 MG/ML IJ SOLN: 6.2500 mg | INTRAMUSCULAR | Status: DC | PRN

## 2015-11-13 MED ORDER — EPHEDRINE SULFATE 50 MG/ML IJ SOLN
INTRAMUSCULAR | Status: AC
  Filled 2015-11-13: qty 1

## 2015-11-13 MED ORDER — PROMETHAZINE HCL 25 MG/ML IJ SOLN: 6.2500 mg | INTRAMUSCULAR | Status: DC | PRN

## 2015-11-13 MED ORDER — ONDANSETRON HCL 4 MG/2ML IJ SOLN: 4.0000 mg | Freq: Four times a day (QID) | INTRAMUSCULAR | Status: DC | PRN

## 2015-11-13 MED ORDER — HYDROMORPHONE 1 MG/ML IV SOLN
INTRAVENOUS | Status: AC
  Filled 2015-11-13: qty 25

## 2015-11-13 MED ORDER — ONDANSETRON HCL 4 MG/2ML IJ SOLN
INTRAMUSCULAR | Status: AC
  Filled 2015-11-13: qty 2

## 2015-11-13 MED ORDER — LIDOCAINE HCL (CARDIAC) 20 MG/ML IV SOLN
INTRAVENOUS | Status: AC
  Filled 2015-11-13: qty 5

## 2015-11-13 SURGICAL SUPPLY — 65 items
BANDAGE ELASTIC 4 VELCRO ST LF (GAUZE/BANDAGES/DRESSINGS) ×3 IMPLANT
BANDAGE ELASTIC 6 VELCRO ST LF (GAUZE/BANDAGES/DRESSINGS) ×6 IMPLANT
BANDAGE ESMARK 6X9 LF (GAUZE/BANDAGES/DRESSINGS) IMPLANT
BLADE SAW SAG 73X25 THK (BLADE) ×2
BLADE SAW SGTL 73X25 THK (BLADE) ×1 IMPLANT
BLADE SURG 10 STRL SS (BLADE) ×3 IMPLANT
BNDG COHESIVE 4X5 TAN STRL (GAUZE/BANDAGES/DRESSINGS) ×3 IMPLANT
BNDG COHESIVE 6X5 TAN STRL LF (GAUZE/BANDAGES/DRESSINGS) ×3 IMPLANT
BNDG ESMARK 6X9 LF (GAUZE/BANDAGES/DRESSINGS)
BNDG GAUZE ELAST 4 BULKY (GAUZE/BANDAGES/DRESSINGS) ×3 IMPLANT
BRUSH SCRUB DISP (MISCELLANEOUS) ×6 IMPLANT
COVER MAYO STAND STRL (DRAPES) ×3 IMPLANT
COVER SURGICAL LIGHT HANDLE (MISCELLANEOUS) ×6 IMPLANT
CUFF TOURNIQUET SINGLE 34IN LL (TOURNIQUET CUFF) IMPLANT
CUFF TOURNIQUET SINGLE 44IN (TOURNIQUET CUFF) IMPLANT
DRAIN PENROSE 1/2X12 LTX STRL (WOUND CARE) IMPLANT
DRAPE EXTREMITY T 121X128X90 (DRAPE) ×3 IMPLANT
DRAPE PROXIMA HALF (DRAPES) ×6 IMPLANT
DRAPE U-SHAPE 47X51 STRL (DRAPES) ×3 IMPLANT
DRSG ADAPTIC 3X8 NADH LF (GAUZE/BANDAGES/DRESSINGS) ×3 IMPLANT
DRSG MEPITEL 4X7.2 (GAUZE/BANDAGES/DRESSINGS) ×3 IMPLANT
DRSG PAD ABDOMINAL 8X10 ST (GAUZE/BANDAGES/DRESSINGS) ×3 IMPLANT
EVACUATOR 1/8 PVC DRAIN (DRAIN) IMPLANT
GAUZE SPONGE 4X4 12PLY STRL (GAUZE/BANDAGES/DRESSINGS) ×3 IMPLANT
GLOVE BIO SURGEON STRL SZ7.5 (GLOVE) ×3 IMPLANT
GLOVE BIO SURGEON STRL SZ8 (GLOVE) ×3 IMPLANT
GLOVE BIOGEL PI IND STRL 7.5 (GLOVE) ×1 IMPLANT
GLOVE BIOGEL PI IND STRL 8 (GLOVE) ×1 IMPLANT
GLOVE BIOGEL PI INDICATOR 7.5 (GLOVE) ×2
GLOVE BIOGEL PI INDICATOR 8 (GLOVE) ×2
GOWN STRL REUS W/ TWL LRG LVL3 (GOWN DISPOSABLE) ×2 IMPLANT
GOWN STRL REUS W/ TWL XL LVL3 (GOWN DISPOSABLE) ×1 IMPLANT
GOWN STRL REUS W/TWL LRG LVL3 (GOWN DISPOSABLE) ×4
GOWN STRL REUS W/TWL XL LVL3 (GOWN DISPOSABLE) ×2
KIT ROOM TURNOVER OR (KITS) ×3 IMPLANT
MANIFOLD NEPTUNE II (INSTRUMENTS) ×3 IMPLANT
NS IRRIG 1000ML POUR BTL (IV SOLUTION) ×3 IMPLANT
PACK GENERAL/GYN (CUSTOM PROCEDURE TRAY) ×3 IMPLANT
PAD ARMBOARD 7.5X6 YLW CONV (MISCELLANEOUS) ×6 IMPLANT
PAD CAST 4YDX4 CTTN HI CHSV (CAST SUPPLIES) ×2 IMPLANT
PADDING CAST COTTON 4X4 STRL (CAST SUPPLIES) ×4
RETRIEVER SUT HEWSON (MISCELLANEOUS) ×3 IMPLANT
SPONGE GAUZE 4X4 12PLY STER LF (GAUZE/BANDAGES/DRESSINGS) ×3 IMPLANT
SPONGE LAP 18X18 X RAY DECT (DISPOSABLE) IMPLANT
STAPLER VISISTAT 35W (STAPLE) IMPLANT
STOCKINETTE IMPERVIOUS LG (DRAPES) IMPLANT
SUT ETHILON 2 0 FS 18 (SUTURE) IMPLANT
SUT PDS AB 2-0 CT1 27 (SUTURE) IMPLANT
SUT PROLENE 0 CT 1 30 (SUTURE) ×6 IMPLANT
SUT PROLENE 2 0 CT 1 (SUTURE) ×3 IMPLANT
SUT SILK 2 0 (SUTURE)
SUT SILK 2 0 SH CR/8 (SUTURE) ×3 IMPLANT
SUT SILK 2-0 18XBRD TIE 12 (SUTURE) IMPLANT
SUT VIC AB 0 CT1 27 (SUTURE) ×4
SUT VIC AB 0 CT1 27XBRD ANBCTR (SUTURE) ×2 IMPLANT
SUT VIC AB 2-0 CT1 27 (SUTURE) ×4
SUT VIC AB 2-0 CT1 TAPERPNT 27 (SUTURE) ×2 IMPLANT
SUT VICRYL AB 2 0 TIES (SUTURE) ×3 IMPLANT
SWAB COLLECTION DEVICE MRSA (MISCELLANEOUS) IMPLANT
TOWEL OR 17X24 6PK STRL BLUE (TOWEL DISPOSABLE) ×3 IMPLANT
TOWEL OR 17X26 10 PK STRL BLUE (TOWEL DISPOSABLE) ×6 IMPLANT
TUBE ANAEROBIC SPECIMEN COL (MISCELLANEOUS) IMPLANT
TUBE CONNECTING 12'X1/4 (SUCTIONS) ×1
TUBE CONNECTING 12X1/4 (SUCTIONS) ×2 IMPLANT
WATER STERILE IRR 1000ML POUR (IV SOLUTION) ×3 IMPLANT
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
## 2015-11-13 NOTE — Progress Notes (Signed)
At this time RN is not comfortable giving Dilaudid (1853). Patient has received Oxy 15 mg at 1719,  Percocet 2 tabs at 1553, and Robaxin 1000 mg at 1553. Patient states he is in pain and only able to make eye contact for very brief moments without drifting to sleep. Mother is adament about patient having more narcotics. Nurse has educated parents on the use of Dilaudid (for breakthrough pain).

## 2015-11-13 NOTE — Anesthesia Procedure Notes (Addendum)
Anesthesia Regional Block:  Popliteal block  Pre-Anesthetic Checklist: ,, timeout performed, Correct Patient, Correct Site, Correct Laterality, Correct Procedure, Correct Position, site marked, Risks and benefits discussed, pre-op evaluation, post-op pain management  Laterality: Left  Prep: chloraprep       Needles:  Injection technique: Single-shot  Needle Type: Stimiplex     Needle Length: 10cm 10 cm Needle Gauge: 21 and 21 G    Additional Needles:  Procedures: ultrasound guided (picture in chart)  Motor weakness within 5 minutes. Popliteal block Narrative:  Injection made incrementally with aspirations every 5 mL.  Performed by: Personally  Anesthesiologist: Lewie LoronGERMEROTH, JOHN  Additional Notes: Nerve located and needle positioned with direct ultrasound guidance. Good perineural spread. Patient tolerated well.   Anesthesia Regional Block:  Adductor canal block  Pre-Anesthetic Checklist: ,, timeout performed, Correct Patient, Correct Site, Correct Laterality, Correct Procedure, Correct Position, site marked, Risks and benefits discussed, pre-op evaluation, post-op pain management  Laterality: Left  Prep: chloraprep       Needles:  Injection technique: Single-shot  Needle Type: Stimiplex     Needle Length: 9cm 9 cm Needle Gauge: 21 and 21 G    Additional Needles:  Procedures: ultrasound guided (picture in chart) Adductor canal block Narrative:  Injection made incrementally with aspirations every 5 mL.  Performed by: Personally  Anesthesiologist: Lewie LoronGERMEROTH, JOHN  Additional Notes: BP cuff, EKG monitors applied. Sedation begun. Artery and nerve location verified with U/S and anesthetic injected incrementally, slowly, and after negative aspirations under direct u/s guidance. Good fascial /perineural spread. Tolerated well.   Procedure Name: LMA Insertion Date/Time: 11/13/2015 8:28 AM Performed by: Bishop LimboARTER, Ashraf Mesta S Pre-anesthesia Checklist: Patient  identified, Emergency Drugs available, Suction available, Patient being monitored and Timeout performed Patient Re-evaluated:Patient Re-evaluated prior to inductionOxygen Delivery Method: Circle system utilized Preoxygenation: Pre-oxygenation with 100% oxygen Intubation Type: IV induction LMA: LMA inserted LMA Size: 5.0 Number of attempts: 1 Placement Confirmation: positive ETCO2,  CO2 detector and breath sounds checked- equal and bilateral Tube secured with: Tape Dental Injury: Teeth and Oropharynx as per pre-operative assessment

## 2015-11-13 NOTE — Transfer of Care (Signed)
Immediate Anesthesia Transfer of Care Note  Patient: Darius [name]  Procedure(s) Performed: Procedure(s): AMPUTATION BELOW KNEE (Left)  Patient Location: PACU  Anesthesia Type:General  Level of Consciousness: awake, patient cooperative and responds to stimulation  Airway & Oxygen Therapy: Patient Spontanous Breathing and Patient connected to nasal cannula oxygen  Post-op Assessment: Report given to RN and Post -op Vital signs reviewed and stable  Post vital signs: Reviewed and stable  Last Vitals:  Filed Vitals:   11/13/15 0600 11/13/15 1228  BP: 124/55 160/97  Pulse: 72 118  Temp: 36.7 C 37.5 C  Resp: 16 19    Complications: No apparent anesthesia complications

## 2015-11-13 NOTE — Progress Notes (Signed)
OT Cancellation Note    11/13/15 0856  OT Visit Information  Reason Eval/Treat Not Completed Patient at procedure or test/ unavailable  Per RN; pt currently in surgery. Will follow up for OT eval as time allows and pt is appropriate.   Hurshel PartyBailey Marty Sadlowski, M.S., OTR/L Pager: [phone]  11/13/15, 8:58 AM

## 2015-11-13 NOTE — Progress Notes (Signed)
Spoke with Darius ForestShirley in PACU and conveyed message given by pt's mother which was during his last surgery he came up to the floor and was in sever pain and was not able to get pain medications for about 20 minutes. Suggested that maybe they can give him pain medication right before coming to the floor or call RN on floor ahead of time to give chance to release orders so it can be reviewed by pharmacy to prevent incident and provide safe patient care. Floor nurse [place]any also aware.

## 2015-11-13 NOTE — Anesthesia Preprocedure Evaluation (Addendum)
Anesthesia Evaluation  Patient identified by MRN, date of birth, ID band Patient awake    Reviewed: Allergy & Precautions, H&P , NPO status , Patient's Chart, lab work & pertinent test results  History of Anesthesia Complications Negative for: history of anesthetic complications  Airway Mallampati: I  TM Distance: >3 FB Neck ROM: full    Dental no notable dental hx. 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,   Endo/Other  negative endocrine ROS  Renal/GU negative Renal ROS     Musculoskeletal   Abdominal   Peds  Hematology negative hematology ROS (+)   Anesthesia Other Findings   Reproductive/Obstetrics negative OB ROS                             Anesthesia Physical  Anesthesia Plan  ASA: II  Anesthesia Plan: General and Regional   Post-op Pain Management: MAC Combined w/ Regional for Post-op pain   Induction: Intravenous  Airway Management Planned: LMA  Additional Equipment:   Intra-op Plan:   Post-operative Plan: Extubation in OR  Informed Consent: I have reviewed the patients History and Physical, chart, labs and discussed the procedure including the risks, benefits and alternatives for the proposed anesthesia with the patient or authorized representative who has indicated his/her understanding and acceptance.   Dental advisory given  Plan Discussed with: CRNA  Anesthesia Plan Comments:        Anesthesia Quick Evaluation

## 2015-11-13 NOTE — Clinical Documentation Improvement (Signed)
Orthopedic  Please further clarify procedure documentation in the medical record of "debridement."  Thank you    Type - excisional, non-excisional  Other condition  Clinically Undetermined   Supporting Information: :    Op note "...there were several new areas of declared necrosis along the skin and subcutaneous tissue which were debrided sharply, also some fascia along the anterolateral aspect of the ankle. Also, some anterior tendons including the EDC and the posterior flexor tendons that were debrided for lack of healthy appearance. Once these sharp debridements were completed,..."    Please exercise your independent, professional judgment when responding. A specific answer is not anticipated or expected.   Thank You, Lavonda JumboLawanda J Ersilia Brawley [facility] [employer] [facility] [place] [phone]

## 2015-11-13 NOTE — Care Management (Signed)
Utilization review completed. Mattison Stuckey, RN Case Manager [phone]. 

## 2015-11-13 NOTE — Anesthesia Postprocedure Evaluation (Signed)
Anesthesia Post Note  Patient: Darius [name]  Procedure(s) Performed: Procedure(s) (LRB): AMPUTATION BELOW KNEE (Left)  Patient location during evaluation: PACU Anesthesia Type: General and Regional Level of consciousness: sedated and patient cooperative Pain management: pain level controlled Vital Signs Assessment: post-procedure vital signs reviewed and stable Respiratory status: spontaneous breathing Cardiovascular status: stable Anesthetic complications: no    Last Vitals:  Filed Vitals:   11/13/15 1245 11/13/15 1300  BP: 143/82 143/74  Pulse: 104 99  Temp:  37.1 C  Resp: 15 13    Last Pain:  Filed Vitals:   11/13/15 1316  PainSc: 0-No pain                 Darius [name]

## 2015-11-14 ENCOUNTER — Encounter (HOSPITAL_COMMUNITY): Payer: Self-pay | Admitting: Orthopedic Surgery

## 2015-11-14 DIAGNOSIS — S88112S Complete traumatic amputation at level between knee and ankle, left lower leg, sequela: Secondary | ICD-10-CM

## 2015-11-14 DIAGNOSIS — G8918 Other acute postprocedural pain: Secondary | ICD-10-CM

## 2015-11-14 DIAGNOSIS — S88112A Complete traumatic amputation at level between knee and ankle, left lower leg, initial encounter: Secondary | ICD-10-CM | POA: Diagnosis present

## 2015-11-14 DIAGNOSIS — R509 Fever, unspecified: Secondary | ICD-10-CM

## 2015-11-14 DIAGNOSIS — Z89512 Acquired absence of left leg below knee: Secondary | ICD-10-CM

## 2015-11-14 DIAGNOSIS — S9782XS Crushing injury of left foot, sequela: Secondary | ICD-10-CM

## 2015-11-14 DIAGNOSIS — D62 Acute posthemorrhagic anemia: Secondary | ICD-10-CM | POA: Diagnosis not present

## 2015-11-14 DIAGNOSIS — Z89519 Acquired absence of unspecified leg below knee: Secondary | ICD-10-CM | POA: Diagnosis present

## 2015-11-14 MED ORDER — PREGABALIN 100 MG PO CAPS
100.0000 mg | ORAL_CAPSULE | Freq: Three times a day (TID) | ORAL | Status: DC
  Administered 2015-11-14 – 2015-11-21 (×22): 100 mg via ORAL
  Filled 2015-11-14 (×22): qty 1

## 2015-11-14 MED ORDER — HYDROXYZINE HCL 25 MG PO TABS
50.0000 mg | ORAL_TABLET | Freq: Three times a day (TID) | ORAL | Status: DC
  Administered 2015-11-14 – 2015-11-15 (×5): 50 mg via ORAL
  Filled 2015-11-14 (×5): qty 2

## 2015-11-14 MED ORDER — TAPENTADOL HCL 50 MG PO TABS
50.0000 mg | ORAL_TABLET | Freq: Four times a day (QID) | ORAL | Status: DC | PRN
  Administered 2015-11-14 – 2015-11-16 (×6): 100 mg via ORAL
  Filled 2015-11-14 (×6): qty 2

## 2015-11-14 MED ORDER — DIAZEPAM 5 MG PO TABS
ORAL_TABLET | ORAL | Status: AC
  Administered 2015-11-14: 5 mg
  Filled 2015-11-14: qty 1

## 2015-11-14 MED ORDER — DIAZEPAM 5 MG PO TABS: 5.0000 mg | ORAL_TABLET | Freq: Once | ORAL | Status: DC

## 2015-11-14 MED ORDER — HYDROMORPHONE HCL 1 MG/ML IJ SOLN
2.0000 mg | Freq: Once | INTRAMUSCULAR | Status: AC
  Administered 2015-11-14: 2 mg via INTRAVENOUS
  Filled 2015-11-14: qty 2

## 2015-11-14 MED ORDER — HYDROCODONE-ACETAMINOPHEN 10-325 MG PO TABS
1.0000 | ORAL_TABLET | Freq: Four times a day (QID) | ORAL | Status: DC | PRN
  Administered 2015-11-14: 2 via ORAL
  Administered 2015-11-14: 1 via ORAL
  Administered 2015-11-15: 2 via ORAL
  Filled 2015-11-14: qty 1
  Filled 2015-11-14: qty 2
  Filled 2015-11-14: qty 1
  Filled 2015-11-14: qty 2
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
## 2015-11-14 NOTE — Progress Notes (Signed)
Pt is still in pain. All pain medications has been given at max doses and on PRN schedule.  Lindwood QuaKelly Brittany, PA has been paged again to notify. Will await orders.
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
## 2015-11-14 NOTE — Progress Notes (Signed)
Spoke with Lindwood QuaKelly Brittany, PA. Ok to give Robxin now.

## 2015-11-14 NOTE — Progress Notes (Signed)
Inpatient Rehabilitation  Patient was screened by Jeremi Losito for appropriateness for an Inpatient Acute Rehab consult.  At this time we are recommending an Inpatient Rehab consult.  Please order if you are agreeable.    Houston Surges, M.A., CCC/SLP Admission Coordinator  [facility]  Cell [phone]  

## 2015-11-14 NOTE — Progress Notes (Signed)
At 0100 pt stated severe pain 10/10. Dilaudid 2mg  given at 2312. Prior medications were Robaxin 1000mg  at 2205, Percocet at 2205, Lyrica at 2205.  Additioinal Oxycodone IR 15 mg and Zofran 8mg  both given at 2332. After dilaudid given at 2312 pt still complained of pain. Dr. Eulah PontMurphy paged at 0010. Orders were to give an additional dose of Dilaudid 2mg  which was given at 0101. Feet is elevated and iced. After additional dose of Dilaudid pt's O2 saturation decreased to 80s. O2 3L placed on pt along with continous pulse ox. Oxygen level went back up to 100%. Pt called out in pain again at 0139 after additional dose of Dilaudid. Lindwood QuaKelly Brittany paged at 0139. Orders were to give Valium 5mg  and PO pain medications and Dilaudid when it is due. No additional doses were prescribed due to pt safety. Pain medication is given around the clock and on time. Having a hard time getting pain under control. Will continue to monitor pt very closely. Dilaudid seems to be best control for pt but only for a short amount of time.

## 2015-11-14 NOTE — Progress Notes (Signed)
ANTIBIOTIC CONSULT NOTE - FOLLOW UP  Pharmacy consult for Gentamycin Indication: Traumatic L midfoot amputation  (open wound)  No Known Allergies  Patient Measurements: Height: 6\' 1"  (185.4 cm) Weight: 159 lb 13.3 oz (72.5 kg) IBW/kg (Calculated) : 79.9  Vital Signs: Temp: 100.5 F (38.1 C) (12/14 1242) Temp Source: Oral (12/14 0500) BP: 129/63 mmHg (12/14 1242) Pulse Rate: 95 (12/14 1242)   Intake/Output from previous day: 12/13 0701 - 12/14 0700 In: 1430 [P.O.:480; I.V.:900; IV Piggyback:50] Out: 1000 [Urine:1000]   Labs:  Recent Labs  11/13/15 0630  WBC 4.8  HGB 9.2*  PLT 210  CREATININE 0.92  Estimated CrCl: > 100 ml/min    Microbiology: Anti-infective's  12/10 Cefazolin >> 12/15 12/10 Gentamycin 7 mg/kg (Ext interval) >> 12/15  Cx data:  12/11 MRSA by PCR: negative   Pertinent labs/levels drawn this admission: 12/10 gentamycin random: 0.7 drawn ~ 10 hours 500 mg dose and SCr 0.93  Assessment: 19 yoM admitted with L foot traumatic crush injury with open wound and concern poly microbial contamination/infection. 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 Per orthopedics plan to stop antibiotics after 12/15 doses received.  Goal of Therapy:  Gentamycin level < 0.5 if drawn at 24 hours   Plan:  1.) Will add stop dates of 12/15 to both Cefazolin and Gentamycin; in the interim continue with current dosing regimens 2.) Following along with you daily  Pollyann SamplesAndy Kaydee Magel, PharmD, BCPS 11/14/2015, 1:14 PM Pager: [phone]
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
## 2015-11-15 ENCOUNTER — Encounter (HOSPITAL_COMMUNITY): Payer: Self-pay | Admitting: General Practice

## 2015-11-15 LAB — COMPREHENSIVE METABOLIC PANEL
ALBUMIN: 3.2 g/dL — AB (ref 3.5–5.0)
ALT: 51 U/L (ref 17–63)
ANION GAP: 10 (ref 5–15)
AST: 226 U/L — ABNORMAL HIGH (ref 15–41)
Alkaline Phosphatase: 60 U/L (ref 38–126)
BUN: 8 mg/dL (ref 6–20)
CHLORIDE: 93 mmol/L — AB (ref 101–111)
CO2: 27 mmol/L (ref 22–32)
Calcium: 9 mg/dL (ref 8.9–10.3)
Creatinine, Ser: 0.94 mg/dL (ref 0.61–1.24)
GFR calc non Af Amer: >60 mL/min (ref >60)
Glucose, Bld: 114 mg/dL — ABNORMAL HIGH (ref 65–99)
Potassium: 4.4 mmol/L (ref 3.5–5.1)
SODIUM: 130 mmol/L — AB (ref 135–145)
Total Bilirubin: 0.5 mg/dL (ref 0.3–1.2)
Total Protein: 6.7 g/dL (ref 6.5–8.1)

## 2015-11-15 LAB — CBC
HCT: 31.3 % — ABNORMAL LOW (ref 39.0–52.0)
Hemoglobin: 10.5 g/dL — ABNORMAL LOW (ref 13.0–17.0)
MCH: 29.7 pg (ref 26.0–34.0)
MCHC: 33.5 g/dL (ref 30.0–36.0)
MCV: 88.4 fL (ref 78.0–100.0)
PLATELETS: 319 10*3/uL (ref 150–400)
RBC: 3.54 MIL/uL — AB (ref 4.22–5.81)
RDW: 12.5 % (ref 11.5–15.5)
WBC: 8.1 10*3/uL (ref 4.0–10.5)

## 2015-11-15 MED ORDER — ACETAMINOPHEN 325 MG PO TABS
325.0000 mg | ORAL_TABLET | Freq: Four times a day (QID) | ORAL | Status: DC | PRN
Start: 2015-11-15 — End: 2015-11-19

## 2015-11-15 MED ORDER — HYDROCODONE-ACETAMINOPHEN 10-325 MG PO TABS: 1.0000 | ORAL_TABLET | Freq: Four times a day (QID) | ORAL | Status: DC | PRN

## 2015-11-15 NOTE — Progress Notes (Signed)
Pharmacy Antibiotic Follow-up Note  Darius [name] is a 19 y.o. year-old male admitted on 11/10/2015.  The patient is currently on day 6 of Cefazolin and Gentamcycin for L foot traumatic crush injury with open wound and concern poly microbial contamination/infection. 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 Per orthopedics plan to stop antibiotics after 12/15 doses received.  Assessment/Plan: 1. Received last gentamycin dose this morning and Cefazolin to stop tonight after 22:00 dose received; will s/o please re consult if longer course of antimicrobials is felt to be needed   Temp (24hrs), Avg:99.1 F (37.3 C), Min:97.9 F (36.6 C), Max:101.3 F (38.5 C)   Recent Labs Lab 11/10/15 0104 11/10/15 2250 11/13/15 0630 11/15/15 0730  WBC 10.0 8.5 4.8 8.1    Recent Labs Lab 11/10/15 0104 11/13/15 0630 11/15/15 0730  CREATININE 0.93 0.92 0.94   No Known Allergies  Antimicrobials this admission: 12/10 Cefazolin >> 12/15 12/10 Gentamycin 7 mg/kg (Ext interval) >> 12/15  Levels/dose changes this admission: 12/10 gentamycin random: 0.7 drawn ~ 10 hours 500 mg dose and SCr 0.93  Microbiology results: 12/11 MRSA by PCR: negative  Thank you for allowing pharmacy to be a part of this patient's care.  Pollyann SamplesAndy Takai Chiaramonte, PharmD, BCPS 11/15/2015, 1:38 PM Pager: [phone]

## 2015-11-15 NOTE — Progress Notes (Signed)
Inpatient Rehabilitation  I met with the patient  And his parents at the bedside to discuss recommendation of CIR.  I provided informational booklets and answered their questions.  Family would like pt. To remain here at [facility] for his rehabilitation.  I will initiate insurance authorization process and will tentatively plan for admission when medically ready.  Mary, RN states she is working to decrease pt.'s need for IV pain med.  Currently, pt. Somewhat sedated but will arouse to answer questions.  He is agreeable with rehab plan.  I have updated Ricki Miller of the above.  Please call if questions.  [facility] Admissions Coordinator Cell [phone] Office [phone]
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
## 2015-11-15 NOTE — Progress Notes (Signed)
Pt has being lethargic but arousable most of the night. Pain med has being given around the clock at patient's request with pain scale always at a 10/10. Vitals has been within normal range. Leg elevated on a pillow and iced as well. Patient received education on pain med and other helpful interventions. Will cont to monitor.

## 2015-11-15 NOTE — Care Management (Signed)
Patient will be going to CIR prior to returning to [place] with parents for the holidays.
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
## 2015-11-16 LAB — CBC WITH DIFFERENTIAL/PLATELET
BASOS ABS: 0 10*3/uL (ref 0.0–0.1)
BASOS PCT: 0 %
EOS ABS: 0 10*3/uL (ref 0.0–0.7)
Eosinophils Relative: 0 %
HEMATOCRIT: 29.2 % — AB (ref 39.0–52.0)
HEMOGLOBIN: 10.1 g/dL — AB (ref 13.0–17.0)
Lymphocytes Relative: 19 %
Lymphs Abs: 1.5 10*3/uL (ref 0.7–4.0)
MCH: 30.1 pg (ref 26.0–34.0)
MCHC: 34.6 g/dL (ref 30.0–36.0)
MCV: 87.2 fL (ref 78.0–100.0)
Monocytes Absolute: 1.1 10*3/uL — ABNORMAL HIGH (ref 0.1–1.0)
Monocytes Relative: 15 %
NEUTROS ABS: 5.1 10*3/uL (ref 1.7–7.7)
NEUTROS PCT: 66 %
Platelets: 340 10*3/uL (ref 150–400)
RBC: 3.35 MIL/uL — AB (ref 4.22–5.81)
RDW: 12.4 % (ref 11.5–15.5)
WBC: 7.8 10*3/uL (ref 4.0–10.5)

## 2015-11-16 MED ORDER — TAPENTADOL HCL 50 MG PO TABS
50.0000 mg | ORAL_TABLET | Freq: Four times a day (QID) | ORAL | Status: DC | PRN
  Administered 2015-11-17 (×2): 100 mg via ORAL
  Filled 2015-11-16 (×2): qty 2

## 2015-11-16 MED ORDER — HYDROCODONE-ACETAMINOPHEN 10-325 MG PO TABS
1.0000 | ORAL_TABLET | Freq: Four times a day (QID) | ORAL | Status: DC | PRN
  Administered 2015-11-16 – 2015-11-17 (×6): 2 via ORAL
  Filled 2015-11-16 (×6): qty 2

## 2015-11-16 NOTE — Progress Notes (Signed)
During change of shift report this morning. Pt was asking about when his next dose of pain medication was and fell asleep in the middle of his sentence. No distress noted. O2 saturation was 92 and respirations were 15. Day shift Rn and Night shift Rn present. Pt easily arousal by voice.
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
## 2015-11-16 NOTE — Progress Notes (Signed)
Inpatient Rehabilitation  I await [place] decision from [name].  I note that per [name]'s note, ortho is considering transfer to CIR on Monday.  I spoke with pt. and his parents.  Pt's Dad stated that the surgeon recommended that pt. remain on acute over the weekend and consider admission to CIR on Monday.  I have advised them that pt. may progress adequately over the weekend that he may not require IP Rehab.  Will follow up on Monday.Please call if questions.  Weldon PickingSusan Maydelin Deming PT Inpatient Rehab Admissions Coordinator Cell [phone] Office [phone]

## 2015-11-16 NOTE — Care Management (Signed)
Utilization review completed. Kelicia Youtz, RN Case Manager [phone]. 
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
## 2015-11-17 LAB — CREATININE, SERUM
CREATININE: 0.75 mg/dL (ref 0.61–1.24)
GFR calc Af Amer: >60 mL/min (ref >60)
GFR calc non Af Amer: >60 mL/min (ref >60)

## 2015-11-17 MED ORDER — OXYCODONE HCL 5 MG PO TABS
5.0000 mg | ORAL_TABLET | ORAL | Status: DC | PRN
  Administered 2015-11-18 – 2015-11-19 (×4): 5 mg via ORAL
  Filled 2015-11-17 (×3): qty 1

## 2015-11-17 MED ORDER — DIAZEPAM 5 MG PO TABS
5.0000 mg | ORAL_TABLET | Freq: Four times a day (QID) | ORAL | Status: DC | PRN
  Administered 2015-11-17 – 2015-11-18 (×4): 5 mg via ORAL
  Filled 2015-11-17 (×4): qty 1

## 2015-11-17 MED ORDER — OXYCODONE HCL 5 MG PO TABS
10.0000 mg | ORAL_TABLET | ORAL | Status: DC | PRN
  Administered 2015-11-18: 10 mg via ORAL
  Filled 2015-11-17 (×3): qty 2

## 2015-11-17 MED ORDER — DIAZEPAM 5 MG PO TABS
5.0000 mg | ORAL_TABLET | Freq: Once | ORAL | Status: AC
  Administered 2015-11-17: 5 mg via ORAL
  Filled 2015-11-17: qty 1
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
## 2015-11-17 NOTE — Progress Notes (Signed)
Subjective: 4 Days Post-Op Procedure(s) (LRB): AMPUTATION BELOW KNEE (Left) Patient reports pain as his pain is very labile.  Now he is resting comfortably and asleep but easily arousable.    Objective: Vital signs in last 24 hours: Temp:  [97.7 F (36.5 C)-99.8 F (37.7 C)] 97.7 F (36.5 C) (12/17 0358) Pulse Rate:  [72-83] 83 (12/17 0358) Resp:  [14-16] 16 (12/17 0358) BP: (134-149)/(60-78) 149/73 mmHg (12/17 0358) SpO2:  [96 %-100 %] 100 % (12/17 0358)  Intake/Output from previous day: 12/16 0701 - 12/17 0700 In: 960 [P.O.:960] Out: -  Intake/Output this shift:     Recent Labs  11/15/15 0730 11/16/15 0632  HGB 10.5* 10.1*    Recent Labs  11/15/15 0730 11/16/15 0632  WBC 8.1 7.8  RBC 3.54* 3.35*  HCT 31.3* 29.2*  PLT 319 340    Recent Labs  11/15/15 0730 11/17/15 0527  NA 130*  --   K 4.4  --   CL 93*  --   CO2 27  --   BUN 8  --   CREATININE 0.94 0.75  GLUCOSE 114*  --   CALCIUM 9.0  --    No results for input(s): LABPT, INR in the last 72 hours.  ABD soft Neurovascular intact Sensation intact distally  Assessment/Plan: 4 Days Post-Op Procedure(s) (LRB): AMPUTATION BELOW KNEE (Left)  Active Problems:   Crush injury of foot   S/P unilateral BKA (below knee amputation) (HCC)   Amputation of leg below knee, left, traumatic (HCC)   Pyrexia   Post-operative pain   Acute blood loss anemia  Advance diet Up with therapy  C/R Monday Addition of valium seems to be helping his pain control  Ingram Onnen J 11/17/2015, 9:21 AM

## 2015-11-17 NOTE — Progress Notes (Signed)
Pt still in excruciating pain. PRN medicines on max dose has been given. PA oncall notified. Will await orders.

## 2015-11-17 NOTE — Progress Notes (Signed)
OT Cancellation Note  Patient Details Name: Darius [name] MRN: [id number] DOB: 10/26/1996   Cancelled Treatment:    Reason Eval/Treat Not Completed:  (Family member asked OT to come back later. Pt sleeping)  Earlie RavelingStraub, Annaleigha Woo L OTR/L [phone] 11/17/2015, 12:37 PM

## 2015-11-17 NOTE — Progress Notes (Signed)
Spoke to Genelle BalKirsten Shepperson, PA oncall and ordered one time dose of valium 5mg  P.O. Medicine given to pt. Also pt c/o of nausea. Zofran 8mg  PO given. Will continue to monitor.

## 2015-11-17 NOTE — Progress Notes (Signed)
OT Cancellation Note  Patient Details Name: Darius [name] MRN: [id number] DOB: 10/30/1996   Cancelled Treatment:    Reason Eval/Treat Not Completed: Fatigue/lethargy limiting ability to participate. Pt unable to remain awake. His mother reports that he got very little sleep last night due to pain.   Will check back this afternoon or tomorrow for therapy.    Cipriano MileJohnson, Bassem Bernasconi Elizabeth OTR/L 11/17/2015, 11:15 AM

## 2015-11-17 NOTE — Progress Notes (Signed)
Orthopedic Tech Progress Note Patient Details:  Darius [name] 01/11/1996 [id number]  Ortho Devices Type of Ortho Device: Crutches Ortho Device/Splint Interventions: Application   [name] 11/17/2015, 6:59 PM
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
## 2015-11-18 NOTE — Progress Notes (Signed)
Subjective: 5 Days Post-Op Procedure(s) (LRB): AMPUTATION BELOW KNEE (Left) Patient reports pain as 4 on 0-10 scale.   This patient still had a difficult night last night.  He is much better this am.  He has had 1 valium every 8 hrs and 1 oxycodone every 4 hrs and appears to be managed.  He ambulated on crutches with me from his room to the elevators.  He transferred well from crutches to wheelchair with verbal cue.   I have given him and his mom lots of encouragement to try and stay awake today so that he can have a better night tonight.  He is self propelling his wheel chair and may benefit from having one at D/C.  He did very well ambulating forward with crutches but could continue to work on backing up safely to a chair.  Objective: Vital signs in last 24 hours: Temp:  [98.1 F (36.7 C)-99.1 F (37.3 C)] 98.1 F (36.7 C) (12/18 0539) Pulse Rate:  [69-83] 69 (12/18 0539) Resp:  [16] 16 (12/18 0539) BP: (134)/(55-62) 134/55 mmHg (12/18 0539) SpO2:  [99 %-100 %] 100 % (12/18 0539)  Intake/Output from previous day:   Intake/Output this shift:     Recent Labs  11/16/15 0632  HGB 10.1*    Recent Labs  11/16/15 0632  WBC 7.8  RBC 3.35*  HCT 29.2*  PLT 340    Recent Labs  11/17/15 0527  CREATININE 0.75   No results for input(s): LABPT, INR in the last 72 hours.  ABD soft Neurovascular intact Sensation intact distally Intact pulses distally Dorsiflexion/Plantar flexion intact Incision: dressing C/D/I  Assessment/Plan: 5 Days Post-Op Procedure(s) (LRB): AMPUTATION BELOW KNEE (Left)  Active Problems:   Crush injury of foot   S/P unilateral BKA (below knee amputation) (HCC)   Amputation of leg below knee, left, traumatic (HCC)   Pyrexia   Post-operative pain   Acute blood loss anemia  Advance diet Up with therapy work in discharge tomorrow or Tuesday.  Pain medication.  Will do 1 oxycodone every 4 hrs as needed during the day and 2 oxycodone every 4 hrs as  needed at night. Next due is 1 oxycodone at 12:15 pm.   Will continue Robaxin 1000 mg q 6 hrs schedule with next dose being due at 10 am then 4 pm.  Next valium dose is due at 3:30 but will try to hold off on it until 6 pm so it will help the nighttime anxiety and pain.     Darius [name] 11/18/2015, 9:16 AM
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
## 2015-11-18 NOTE — Progress Notes (Signed)
Received a call for pain medications, when I arrived in the room the patient was diaphoretic and dosing.  I returned the 5 mg of Oxycodone, made sure the patient was comfortable and took his vitals, BP 100/87, HR 87, SP02 97% on Room Air.  Patient is sleeping but having moments of talking in his sleep.  Mom looks very distressed, concerned that he is in severe pain.  I am not comfortable giving any more narcotics until patient is more alert.

## 2015-11-19 LAB — CBC
HCT: 29.5 % — ABNORMAL LOW (ref 39.0–52.0)
Hemoglobin: 10.1 g/dL — ABNORMAL LOW (ref 13.0–17.0)
MCH: 30 pg (ref 26.0–34.0)
MCHC: 34.2 g/dL (ref 30.0–36.0)
MCV: 87.5 fL (ref 78.0–100.0)
PLATELETS: 627 10*3/uL — AB (ref 150–400)
RBC: 3.37 MIL/uL — ABNORMAL LOW (ref 4.22–5.81)
RDW: 12.4 % (ref 11.5–15.5)
WBC: 5.9 10*3/uL (ref 4.0–10.5)

## 2015-11-19 MED ORDER — ACETAMINOPHEN 500 MG PO TABS
1000.0000 mg | ORAL_TABLET | Freq: Four times a day (QID) | ORAL | Status: DC
Start: 2015-11-19 — End: 2015-11-21
  Administered 2015-11-19 – 2015-11-21 (×9): 1000 mg via ORAL
  Filled 2015-11-19 (×9): qty 2

## 2015-11-19 MED ORDER — OXYCODONE HCL 5 MG PO TABS
5.0000 mg | ORAL_TABLET | ORAL | Status: DC | PRN
  Administered 2015-11-19: 5 mg via ORAL
  Administered 2015-11-19 – 2015-11-21 (×8): 10 mg via ORAL
  Filled 2015-11-19 (×4): qty 2
  Filled 2015-11-19: qty 1
  Filled 2015-11-19 (×5): qty 2

## 2015-11-19 MED ORDER — OXYCODONE HCL ER 10 MG PO T12A
10.0000 mg | EXTENDED_RELEASE_TABLET | Freq: Two times a day (BID) | ORAL | Status: DC
  Administered 2015-11-19 – 2015-11-20 (×3): 10 mg via ORAL
  Filled 2015-11-19 (×3): qty 1

## 2015-11-19 MED ORDER — CYCLOBENZAPRINE HCL 10 MG PO TABS
5.0000 mg | ORAL_TABLET | Freq: Three times a day (TID) | ORAL | Status: DC | PRN
  Administered 2015-11-19 – 2015-11-21 (×5): 10 mg via ORAL
  Filled 2015-11-19 (×6): qty 1

## 2015-11-19 MED ORDER — CYCLOBENZAPRINE HCL 5 MG PO TABS: 7.5000 mg | ORAL_TABLET | Freq: Three times a day (TID) | ORAL | Status: DC

## 2015-11-19 MED ORDER — CYCLOBENZAPRINE HCL 10 MG PO TABS: 5.0000 mg | ORAL_TABLET | Freq: Three times a day (TID) | ORAL | Status: DC | PRN

## 2015-11-19 MED ORDER — BACITRACIN-NEOMYCIN-POLYMYXIN 400-5-5000 EX OINT
TOPICAL_OINTMENT | CUTANEOUS | Status: AC
  Filled 2015-11-19: qty 1

## 2015-11-19 NOTE — Progress Notes (Addendum)
Inpatient Rehabilitation  Pt. Is making excellent progress in PT and no longer requires the services of IP Rehab (did a full flight of steps with min guard assist).  I will sign off.  I will stop by to update the patient and his family.  Please call if questions.  Weldon PickingSusan Kandi Brusseau PT Inpatient Rehab Admissions Coordinator Cell [phone] Office [phone]   1124 ADDENDUM: Pt. And family confirm plan to travel to [place] following DC from hospital and express appreciation for CIR involvement.
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
## 2015-11-19 NOTE — Care Management (Signed)
Case manager spoke with patient and his parents concerning discharge plans. They will travel via car to return to [place]. Case manager discussed DME, patient's parents stated they have a walker available when they return home and have access to a wheelchair if needed. Patient states he prefers to use crutches and they have been delivered to his room. Case manager discussed ways with the mom to make the car transport a little more comfortable. Travel mercies wished to the family.
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
## 2015-11-20 DIAGNOSIS — F10929 Alcohol use, unspecified with intoxication, unspecified: Secondary | ICD-10-CM | POA: Diagnosis present

## 2015-11-20 MED ORDER — OXYCODONE HCL 5 MG PO TABS: 5.0000 mg | ORAL_TABLET | ORAL | Status: DC | PRN

## 2015-11-20 MED ORDER — OXYCODONE HCL ER 20 MG PO T12A: 20.0000 mg | EXTENDED_RELEASE_TABLET | Freq: Two times a day (BID) | ORAL | Status: DC

## 2015-11-20 MED ORDER — OXYCODONE HCL ER 10 MG PO T12A
20.0000 mg | EXTENDED_RELEASE_TABLET | Freq: Two times a day (BID) | ORAL | Status: DC
  Administered 2015-11-20 – 2015-11-21 (×2): 20 mg via ORAL
  Filled 2015-11-20 (×2): qty 2

## 2015-11-20 MED ORDER — PREGABALIN 100 MG PO CAPS: 100.0000 mg | ORAL_CAPSULE | Freq: Three times a day (TID) | ORAL | Status: DC

## 2015-11-20 MED ORDER — DOCUSATE SODIUM 100 MG PO CAPS: 100.0000 mg | ORAL_CAPSULE | Freq: Two times a day (BID) | ORAL | Status: DC

## 2015-11-20 MED ORDER — CYCLOBENZAPRINE HCL 5 MG PO TABS: 5.0000 mg | ORAL_TABLET | Freq: Three times a day (TID) | ORAL | Status: DC | PRN

## 2015-11-20 MED ORDER — ENOXAPARIN SODIUM 40 MG/0.4ML ~~LOC~~ SOLN: 40.0000 mg | SUBCUTANEOUS | Status: DC
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
## 2015-11-20 NOTE — Discharge Instructions (Signed)
[facility] Discharge Instructions   General Discharge Instructions  WEIGHT BEARING STATUS: Nonweightbearing Left Limb  RANGE OF MOTION/ACTIVITY: knee range of motion as tolerated. Bend L knee as much as possible. Work on getting Left knee as straight as possible as soon as possible. Keep knee straight when at rest   Wound Care: dressing changes as needed. See instructions below. Change stump shrinker every 2 days as reviewed by Trey PaulaJeff. Once there is no drainage from wounds it is ok to use just the stump shrinker and not put any gauze on the wound   Discharge Wound Care Instructions  Do NOT apply any ointments, solutions or lotions to pin sites or surgical wounds.  These prevent needed drainage and even though solutions like hydrogen peroxide kill bacteria, they also damage cells lining the pin sites that help fight infection.  Applying lotions or ointments can keep the wounds moist and can cause them to breakdown and open up as well. This can increase the risk for infection. When in doubt call the office.  Surgical incisions should be dressed daily.  If any drainage is noted, use one layer of adaptic, then gauze, Kerlix, and an ace wrap.  Once the incision is completely dry and without drainage, it may be left open to air out.  Showering may begin 36-48 hours later.  Cleaning gently with soap and water.  Traumatic wounds should be dressed daily as well.    One layer of adaptic, gauze, Kerlix, then ace wrap.  The adaptic can be discontinued once the draining has ceased    If you have a wet to dry dressing: wet the gauze with saline the squeeze as much saline out so the gauze is moist (not soaking wet), place moistened gauze over wound, then place a dry gauze over the moist one, followed by Kerlix wrap, then ace wrap.  Call office with questions   PAIN MEDICATION USE AND EXPECTATIONS  You have likely been given narcotic medications to help control your pain.  After a  traumatic event that results in an fracture (broken bone) with or without surgery, it is ok to use narcotic pain medications to help control one's pain.  We understand that everyone responds to pain differently and each individual patient will be evaluated on a regular basis for the continued need for narcotic medications. Ideally, narcotic medication use should last no more than 6-8 weeks (coinciding with fracture healing).   As a patient it is your responsibility as well to monitor narcotic medication use and report the amount and frequency you use these medications when you come to your office visit.   We would also advise that if you are using narcotic medications, you should take a dose prior to therapy to maximize you participation.  IF YOU ARE ON NARCOTIC MEDICATIONS IT IS NOT PERMISSIBLE TO OPERATE A MOTOR VEHICLE (MOTORCYCLE/CAR/TRUCK/MOPED) OR HEAVY MACHINERY DO NOT MIX NARCOTICS WITH OTHER CNS (CENTRAL NERVOUS SYSTEM) DEPRESSANTS SUCH AS ALCOHOL  Diet: as you were eating previously.  Can use over the counter stool softeners and bowel preparations, such as Miralax, to help with bowel movements.  Narcotics can be constipating.  Be sure to drink plenty of fluids    STOP SMOKING OR USING NICOTINE PRODUCTS!!!!  As discussed nicotine severely impairs your body's ability to heal surgical and traumatic wounds but also impairs bone healing.  Wounds and bone heal by forming microscopic blood vessels (angiogenesis) and nicotine is a vasoconstrictor (essentially, shrinks blood vessels).  Therefore, if vasoconstriction  occurs to these microscopic blood vessels they essentially disappear and are unable to deliver necessary nutrients to the healing tissue.  This is one modifiable factor that you can do to dramatically increase your chances of healing your injury.    (This means no smoking, no nicotine gum, patches, etc)  DO NOT USE NONSTEROIDAL ANTI-INFLAMMATORY DRUGS (NSAID'S)  Using products such as  Advil (ibuprofen), Aleve (naproxen), Motrin (ibuprofen) for additional pain control during fracture healing can delay and/or prevent the healing response.  If you would like to take over the counter (OTC) medication, Tylenol (acetaminophen) is ok.  However, some narcotic medications that are given for pain control contain acetaminophen as well. Therefore, you should not exceed more than 4000 mg of tylenol in a day if you do not have liver disease.  Also note that there are may OTC medicines, such as cold medicines and allergy medicines that my contain tylenol as well.  If you have any questions about medications and/or interactions please ask your doctor/PA or your pharmacist.      ICE AND ELEVATE INJURED/OPERATIVE EXTREMITY  Using ice and elevating the injured extremity above your heart can help with swelling and pain control.  Icing in a pulsatile fashion, such as 20 minutes on and 20 minutes off, can be followed.    Do not place ice directly on skin. Make sure there is a barrier between to skin and the ice pack.    Using frozen items such as frozen peas works well as the conform nicely to the are that needs to be iced.  USE AN ACE WRAP OR TED HOSE FOR SWELLING CONTROL  In addition to icing and elevation, Ace wraps or TED hose are used to help limit and resolve swelling.  It is recommended to use Ace wraps or TED hose until you are informed to stop.    When using Ace Wraps start the wrapping distally (farthest away from the body) and wrap proximally (closer to the body)   Example: If you had surgery on your leg or thing and you do not have a splint on, start the ace wrap at the toes and work your way up to the thigh        If you had surgery on your upper extremity and do not have a splint on, start the ace wrap at your fingers and work your way up to the upper arm  IF YOU ARE IN A SPLINT OR CAST DO NOT REMOVE IT FOR ANY REASON   If your splint gets wet for any reason please contact the office  immediately. You may shower in your splint or cast as long as you keep it dry.  This can be done by wrapping in a cast cover or garbage back (or similar)  Do Not stick any thing down your splint or cast such as pencils, money, or hangers to try and scratch yourself with.  If you feel itchy take benadryl as prescribed on the bottle for itching  IF YOU ARE IN A CAM BOOT (BLACK BOOT)  You may remove boot periodically. Perform daily dressing changes as noted below.  Wash the liner of the boot regularly and wear a sock when wearing the boot. It is recommended that you sleep in the boot until told otherwise  CALL THE OFFICE WITH ANY QUESTIONS OR CONCERTS: [phone]

## 2015-11-20 NOTE — Progress Notes (Signed)
Orthopedic Tech Progress Note Patient Details:  Darius [name] 12/06/1995 [id number]  Ortho Devices Type of Ortho Device: Knee Immobilizer Ortho Device/Splint Location: lle Ortho Device/Splint Interventions: Application Trapeze bar patient helper  Nikki DomCrawford, Veronia Laprise 11/20/2015, 9:39 AM
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
## 2015-11-20 NOTE — Progress Notes (Signed)
Mom, dad and Darius [name] went out for a walk with crutches

## 2015-11-21 DIAGNOSIS — J45909 Unspecified asthma, uncomplicated: Secondary | ICD-10-CM | POA: Diagnosis present

## 2015-11-21 MED ORDER — DIPHENHYDRAMINE HCL 25 MG PO CAPS
25.0000 mg | ORAL_CAPSULE | Freq: Once | ORAL | Status: AC
  Administered 2015-11-21: 25 mg via ORAL
  Filled 2015-11-21: qty 1

## 2015-11-21 NOTE — Progress Notes (Signed)
Dc instructions given to pt at this time.  Verbalized understanding of all instructions.
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
## 2016-02-04 ENCOUNTER — Encounter (HOSPITAL_COMMUNITY): Payer: Self-pay | Admitting: *Deleted

## 2016-02-04 NOTE — Progress Notes (Signed)
Pt denies cardiac history, chest pain or sob. 

## 2016-02-05 ENCOUNTER — Encounter (HOSPITAL_COMMUNITY)
Admission: RE | Disposition: A | Discharge status: Home / Self Care | Payer: Self-pay | Source: Ambulatory Visit | Attending: Orthopedic Surgery

## 2016-02-05 ENCOUNTER — Inpatient Hospital Stay (HOSPITAL_COMMUNITY): Payer: 59 | Admitting: Anesthesiology

## 2016-02-05 ENCOUNTER — Inpatient Hospital Stay (HOSPITAL_COMMUNITY)
Admission: RE | Admit: 2016-02-05 | Discharge: 2016-02-07 | DRG: 476 | Disposition: A | Discharge status: Home / Self Care | Payer: 59 | Source: Ambulatory Visit | Attending: Orthopedic Surgery | Admitting: Orthopedic Surgery

## 2016-02-05 ENCOUNTER — Encounter (HOSPITAL_COMMUNITY): Payer: Self-pay | Admitting: Anesthesiology

## 2016-02-05 DIAGNOSIS — Z87891 Personal history of nicotine dependence: Secondary | ICD-10-CM | POA: Diagnosis not present

## 2016-02-05 DIAGNOSIS — T8744 Infection of amputation stump, left lower extremity: Secondary | ICD-10-CM | POA: Diagnosis present

## 2016-02-05 DIAGNOSIS — S88119A Complete traumatic amputation at level between knee and ankle, unspecified lower leg, initial encounter: Secondary | ICD-10-CM | POA: Insufficient documentation

## 2016-02-05 DIAGNOSIS — E8889 Other specified metabolic disorders: Secondary | ICD-10-CM | POA: Diagnosis present

## 2016-02-05 DIAGNOSIS — Y835 Amputation of limb(s) as the cause of abnormal reaction of the patient, or of later complication, without mention of misadventure at the time of the procedure: Secondary | ICD-10-CM | POA: Diagnosis present

## 2016-02-05 DIAGNOSIS — T148XXA Other injury of unspecified body region, initial encounter: Secondary | ICD-10-CM

## 2016-02-05 DIAGNOSIS — B9561 Methicillin susceptible Staphylococcus aureus infection as the cause of diseases classified elsewhere: Secondary | ICD-10-CM | POA: Diagnosis present

## 2016-02-05 DIAGNOSIS — L089 Local infection of the skin and subcutaneous tissue, unspecified: Secondary | ICD-10-CM

## 2016-02-05 HISTORY — PX: APPLICATION OF WOUND VAC: SHX5189

## 2016-02-05 HISTORY — DX: Other injury of unspecified body region, initial encounter: T14.8XXA

## 2016-02-05 HISTORY — DX: Local infection of the skin and subcutaneous tissue, unspecified: L08.9

## 2016-02-05 HISTORY — PX: AMPUTATION: SHX166

## 2016-02-05 HISTORY — DX: Pneumonia, unspecified organism: J18.9

## 2016-02-05 LAB — CBC WITH DIFFERENTIAL/PLATELET
Basophils Absolute: 0 10*3/uL (ref 0.0–0.1)
Basophils Relative: 0 %
EOS PCT: 2 %
Eosinophils Absolute: 0.1 10*3/uL (ref 0.0–0.7)
HEMATOCRIT: 41.1 % (ref 39.0–52.0)
Hemoglobin: 13.5 g/dL (ref 13.0–17.0)
LYMPHS PCT: 38 %
Lymphs Abs: 1.7 10*3/uL (ref 0.7–4.0)
MCH: 26.6 pg (ref 26.0–34.0)
MCHC: 32.8 g/dL (ref 30.0–36.0)
MCV: 81.1 fL (ref 78.0–100.0)
MONO ABS: 0.7 10*3/uL (ref 0.1–1.0)
Monocytes Relative: 16 %
NEUTROS ABS: 2 10*3/uL (ref 1.7–7.7)
Neutrophils Relative %: 44 %
PLATELETS: 305 10*3/uL (ref 150–400)
RBC: 5.07 MIL/uL (ref 4.22–5.81)
RDW: 14.6 % (ref 11.5–15.5)
WBC: 4.6 10*3/uL (ref 4.0–10.5)

## 2016-02-05 LAB — COMPREHENSIVE METABOLIC PANEL
ALT: 14 U/L — AB (ref 17–63)
AST: 19 U/L (ref 15–41)
Albumin: 4.3 g/dL (ref 3.5–5.0)
Alkaline Phosphatase: 73 U/L (ref 38–126)
Anion gap: 10 (ref 5–15)
BILIRUBIN TOTAL: 0.5 mg/dL (ref 0.3–1.2)
BUN: 11 mg/dL (ref 6–20)
CALCIUM: 10 mg/dL (ref 8.9–10.3)
CHLORIDE: 103 mmol/L (ref 101–111)
CO2: 25 mmol/L (ref 22–32)
CREATININE: 0.87 mg/dL (ref 0.61–1.24)
Glucose, Bld: 86 mg/dL (ref 65–99)
Potassium: 3.9 mmol/L (ref 3.5–5.1)
Sodium: 138 mmol/L (ref 135–145)
TOTAL PROTEIN: 7.3 g/dL (ref 6.5–8.1)

## 2016-02-05 LAB — URINALYSIS, ROUTINE W REFLEX MICROSCOPIC
BILIRUBIN URINE: NEGATIVE
GLUCOSE, UA: NEGATIVE mg/dL
HGB URINE DIPSTICK: NEGATIVE
KETONES UR: 15 mg/dL — AB
Leukocytes, UA: NEGATIVE
Nitrite: NEGATIVE
PH: 5.5 (ref 5.0–8.0)
Protein, ur: NEGATIVE mg/dL
SPECIFIC GRAVITY, URINE: 1.027 (ref 1.005–1.030)

## 2016-02-05 LAB — RAPID URINE DRUG SCREEN, HOSP PERFORMED
Amphetamines: NOT DETECTED
Barbiturates: NOT DETECTED
Benzodiazepines: POSITIVE — AB
COCAINE: NOT DETECTED
OPIATES: POSITIVE — AB
Tetrahydrocannabinol: POSITIVE — AB

## 2016-02-05 LAB — PROTIME-INR
INR: 1.04 (ref 0.00–1.49)
PROTHROMBIN TIME: 13.8 s (ref 11.6–15.2)

## 2016-02-05 LAB — SEDIMENTATION RATE: Sed Rate: 2 mm/hr (ref 0–16)

## 2016-02-05 LAB — TYPE AND SCREEN
ABO/RH(D): O POS
ANTIBODY SCREEN: NEGATIVE

## 2016-02-05 LAB — APTT: aPTT: 34 seconds (ref 24–37)

## 2016-02-05 LAB — C-REACTIVE PROTEIN: CRP: 0.5 mg/dL (ref <1.0)

## 2016-02-05 SURGERY — AMPUTATION BELOW KNEE
Anesthesia: General | Site: Knee | Laterality: Left

## 2016-02-05 MED ORDER — POLYETHYLENE GLYCOL 3350 17 G PO PACK
17.0000 g | PACK | Freq: Every day | ORAL | Status: DC
  Administered 2016-02-05 – 2016-02-07 (×3): 17 g via ORAL
  Filled 2016-02-05 (×3): qty 1

## 2016-02-05 MED ORDER — LIDOCAINE HCL (CARDIAC) 20 MG/ML IV SOLN
INTRAVENOUS | Status: AC
  Filled 2016-02-05: qty 5

## 2016-02-05 MED ORDER — PANTOPRAZOLE SODIUM 40 MG PO TBEC
40.0000 mg | DELAYED_RELEASE_TABLET | Freq: Every day | ORAL | Status: DC
  Administered 2016-02-05 – 2016-02-07 (×3): 40 mg via ORAL
  Filled 2016-02-05 (×3): qty 1

## 2016-02-05 MED ORDER — 0.9 % SODIUM CHLORIDE (POUR BTL) OPTIME
TOPICAL | Status: DC | PRN
  Administered 2016-02-05: 1000 mL

## 2016-02-05 MED ORDER — SODIUM CHLORIDE 0.9 % IR SOLN
Status: DC | PRN
  Administered 2016-02-05: 3000 mL

## 2016-02-05 MED ORDER — ALUM & MAG HYDROXIDE-SIMETH 200-200-20 MG/5ML PO SUSP: 15.0000 mL | ORAL | Status: DC | PRN

## 2016-02-05 MED ORDER — MIDAZOLAM HCL 2 MG/2ML IJ SOLN
INTRAMUSCULAR | Status: AC
  Filled 2016-02-05: qty 2

## 2016-02-05 MED ORDER — ENOXAPARIN SODIUM 40 MG/0.4ML ~~LOC~~ SOLN
40.0000 mg | SUBCUTANEOUS | Status: DC
  Administered 2016-02-06 – 2016-02-07 (×2): 40 mg via SUBCUTANEOUS
  Filled 2016-02-05 (×2): qty 0.4

## 2016-02-05 MED ORDER — ACETAMINOPHEN 325 MG PO TABS
325.0000 mg | ORAL_TABLET | ORAL | Status: DC | PRN
  Administered 2016-02-06: 650 mg via ORAL
  Filled 2016-02-05: qty 2

## 2016-02-05 MED ORDER — CHLORHEXIDINE GLUCONATE 4 % EX LIQD: 60.0000 mL | Freq: Once | CUTANEOUS | Status: DC

## 2016-02-05 MED ORDER — SODIUM CHLORIDE 0.9% FLUSH: 3.0000 mL | INTRAVENOUS | Status: DC | PRN

## 2016-02-05 MED ORDER — FENTANYL CITRATE (PF) 100 MCG/2ML IJ SOLN
INTRAMUSCULAR | Status: DC | PRN
  Administered 2016-02-05 (×2): 25 ug via INTRAVENOUS
  Administered 2016-02-05: 50 ug via INTRAVENOUS
  Administered 2016-02-05: 25 ug via INTRAVENOUS
  Administered 2016-02-05: 50 ug via INTRAVENOUS
  Administered 2016-02-05: 25 ug via INTRAVENOUS
  Administered 2016-02-05: 50 ug via INTRAVENOUS

## 2016-02-05 MED ORDER — OXYCODONE HCL 5 MG PO TABS
5.0000 mg | ORAL_TABLET | ORAL | Status: DC | PRN
  Administered 2016-02-05 – 2016-02-07 (×12): 10 mg via ORAL
  Filled 2016-02-05 (×11): qty 2

## 2016-02-05 MED ORDER — DEXAMETHASONE SODIUM PHOSPHATE 4 MG/ML IJ SOLN
INTRAMUSCULAR | Status: AC
  Filled 2016-02-05: qty 2

## 2016-02-05 MED ORDER — DOCUSATE SODIUM 100 MG PO CAPS
100.0000 mg | ORAL_CAPSULE | Freq: Every day | ORAL | Status: DC
  Administered 2016-02-06 – 2016-02-07 (×2): 100 mg via ORAL
  Filled 2016-02-05 (×2): qty 1

## 2016-02-05 MED ORDER — ONDANSETRON HCL 4 MG/2ML IJ SOLN
INTRAMUSCULAR | Status: DC | PRN
Start: 2016-02-05 — End: 2016-02-05
  Administered 2016-02-05: 4 mg via INTRAVENOUS

## 2016-02-05 MED ORDER — PROPOFOL 10 MG/ML IV BOLUS
INTRAVENOUS | Status: DC | PRN
  Administered 2016-02-05: 300 mg via INTRAVENOUS

## 2016-02-05 MED ORDER — ONDANSETRON HCL 4 MG/2ML IJ SOLN
INTRAMUSCULAR | Status: AC
  Filled 2016-02-05: qty 2

## 2016-02-05 MED ORDER — FENTANYL CITRATE (PF) 250 MCG/5ML IJ SOLN
INTRAMUSCULAR | Status: AC
  Filled 2016-02-05: qty 5

## 2016-02-05 MED ORDER — LACTATED RINGERS IV SOLN: INTRAVENOUS | Status: DC

## 2016-02-05 MED ORDER — HYDROMORPHONE HCL 1 MG/ML IJ SOLN
INTRAMUSCULAR | Status: AC
  Administered 2016-02-05: 0.5 mg via INTRAVENOUS
  Filled 2016-02-05: qty 1

## 2016-02-05 MED ORDER — HYDROMORPHONE HCL 1 MG/ML IJ SOLN: 0.5000 mg | INTRAMUSCULAR | Status: DC | PRN

## 2016-02-05 MED ORDER — MIDAZOLAM HCL 5 MG/5ML IJ SOLN
INTRAMUSCULAR | Status: DC | PRN
  Administered 2016-02-05: 2 mg via INTRAVENOUS

## 2016-02-05 MED ORDER — MEPERIDINE HCL 25 MG/ML IJ SOLN: 6.2500 mg | INTRAMUSCULAR | Status: DC | PRN

## 2016-02-05 MED ORDER — DEXTROSE 5 % IV SOLN
3.0000 g | INTRAVENOUS | Status: AC
  Administered 2016-02-05: 2 g via INTRAVENOUS
  Filled 2016-02-05: qty 3000

## 2016-02-05 MED ORDER — HYDROMORPHONE HCL 1 MG/ML IJ SOLN
0.2500 mg | INTRAMUSCULAR | Status: DC | PRN
  Administered 2016-02-05 (×3): 0.5 mg via INTRAVENOUS

## 2016-02-05 MED ORDER — OXYCODONE-ACETAMINOPHEN 5-325 MG PO TABS
1.0000 | ORAL_TABLET | Freq: Four times a day (QID) | ORAL | Status: DC | PRN
  Administered 2016-02-05 – 2016-02-07 (×9): 2 via ORAL
  Filled 2016-02-05 (×8): qty 2

## 2016-02-05 MED ORDER — LEVOFLOXACIN IN D5W 750 MG/150ML IV SOLN
750.0000 mg | INTRAVENOUS | Status: DC
Start: 2016-02-05 — End: 2016-02-07
  Administered 2016-02-05 – 2016-02-06 (×2): 750 mg via INTRAVENOUS
  Filled 2016-02-05 (×5): qty 150

## 2016-02-05 MED ORDER — OXYCODONE HCL 5 MG PO TABS
ORAL_TABLET | ORAL | Status: AC
  Administered 2016-02-05: 10 mg via ORAL
  Filled 2016-02-05: qty 2

## 2016-02-05 MED ORDER — ACETAMINOPHEN 325 MG RE SUPP
325.0000 mg | RECTAL | Status: DC | PRN
  Filled 2016-02-05: qty 2

## 2016-02-05 MED ORDER — OXYCODONE-ACETAMINOPHEN 5-325 MG PO TABS
ORAL_TABLET | ORAL | Status: AC
  Filled 2016-02-05: qty 2

## 2016-02-05 MED ORDER — SODIUM CHLORIDE 0.9% FLUSH
3.0000 mL | Freq: Two times a day (BID) | INTRAVENOUS | Status: DC
  Administered 2016-02-06 – 2016-02-07 (×2): 3 mL via INTRAVENOUS

## 2016-02-05 MED ORDER — PROMETHAZINE HCL 25 MG/ML IJ SOLN
6.2500 mg | INTRAMUSCULAR | Status: DC | PRN
  Administered 2016-02-05: 6.25 mg via INTRAVENOUS

## 2016-02-05 MED ORDER — HYDROMORPHONE HCL 1 MG/ML IJ SOLN
INTRAMUSCULAR | Status: AC
  Filled 2016-02-05: qty 1

## 2016-02-05 MED ORDER — ROCURONIUM BROMIDE 50 MG/5ML IV SOLN
INTRAVENOUS | Status: AC
  Filled 2016-02-05: qty 1

## 2016-02-05 MED ORDER — ACETAMINOPHEN 500 MG PO TABS: 1000.0000 mg | ORAL_TABLET | Freq: Once | ORAL | Status: DC

## 2016-02-05 MED ORDER — CEFAZOLIN SODIUM-DEXTROSE 2-3 GM-% IV SOLR
INTRAVENOUS | Status: AC
  Filled 2016-02-05: qty 50

## 2016-02-05 MED ORDER — SODIUM CHLORIDE 0.9 % IV SOLN: 250.0000 mL | INTRAVENOUS | Status: DC | PRN

## 2016-02-05 MED ORDER — ONDANSETRON HCL 4 MG/2ML IJ SOLN: 4.0000 mg | Freq: Four times a day (QID) | INTRAMUSCULAR | Status: DC | PRN

## 2016-02-05 MED ORDER — PHENOL 1.4 % MT LIQD
1.0000 | OROMUCOSAL | Status: DC | PRN
Start: 2016-02-05 — End: 2016-02-07

## 2016-02-05 MED ORDER — GUAIFENESIN-DM 100-10 MG/5ML PO SYRP: 15.0000 mL | ORAL_SOLUTION | ORAL | Status: DC | PRN

## 2016-02-05 MED ORDER — PROMETHAZINE HCL 25 MG/ML IJ SOLN
INTRAMUSCULAR | Status: AC
  Administered 2016-02-05: 6.25 mg via INTRAVENOUS
  Filled 2016-02-05: qty 1

## 2016-02-05 MED ORDER — LACTATED RINGERS IV SOLN
INTRAVENOUS | Status: DC
  Administered 2016-02-05 (×3): via INTRAVENOUS

## 2016-02-05 MED ORDER — PROPOFOL 10 MG/ML IV BOLUS
INTRAVENOUS | Status: AC
  Filled 2016-02-05: qty 20

## 2016-02-05 MED ORDER — LIDOCAINE HCL (CARDIAC) 20 MG/ML IV SOLN
INTRAVENOUS | Status: DC | PRN
  Administered 2016-02-05: 80 mg via INTRAVENOUS

## 2016-02-05 SURGICAL SUPPLY — 75 items
BANDAGE ACE 4X5 VEL STRL LF (GAUZE/BANDAGES/DRESSINGS) ×3 IMPLANT
BANDAGE ACE 6X5 VEL STRL LF (GAUZE/BANDAGES/DRESSINGS) ×3 IMPLANT
BANDAGE ELASTIC 4 VELCRO ST LF (GAUZE/BANDAGES/DRESSINGS) ×3 IMPLANT
BANDAGE ELASTIC 6 VELCRO ST LF (GAUZE/BANDAGES/DRESSINGS) ×3 IMPLANT
BANDAGE ESMARK 6X9 LF (GAUZE/BANDAGES/DRESSINGS) ×1 IMPLANT
BLADE SAW GIGLI 510 (BLADE) ×2 IMPLANT
BLADE SAW GIGLI 510MM (BLADE) ×1
BLADE SAW SAG 73X25 THK (BLADE) ×2
BLADE SAW SGTL 73X25 THK (BLADE) ×1 IMPLANT
BLADE SURG 10 STRL SS (BLADE) ×3 IMPLANT
BNDG COHESIVE 4X5 TAN STRL (GAUZE/BANDAGES/DRESSINGS) IMPLANT
BNDG COHESIVE 6X5 TAN STRL LF (GAUZE/BANDAGES/DRESSINGS) IMPLANT
BNDG ESMARK 6X9 LF (GAUZE/BANDAGES/DRESSINGS) ×3
BNDG GAUZE ELAST 4 BULKY (GAUZE/BANDAGES/DRESSINGS) ×3 IMPLANT
BRUSH SCRUB DISP (MISCELLANEOUS) ×6 IMPLANT
CANISTER WOUND CARE 500ML ATS (WOUND CARE) ×3 IMPLANT
CONT SPECI 4OZ STER CLIK (MISCELLANEOUS) ×3 IMPLANT
COVER MAYO STAND STRL (DRAPES) ×3 IMPLANT
COVER SURGICAL LIGHT HANDLE (MISCELLANEOUS) ×6 IMPLANT
CUFF TOURNIQUET SINGLE 34IN LL (TOURNIQUET CUFF) ×3 IMPLANT
DRAIN PENROSE 1/2X12 LTX STRL (WOUND CARE) ×3 IMPLANT
DRAPE EXTREMITY T 121X128X90 (DRAPE) ×3 IMPLANT
DRAPE PROXIMA HALF (DRAPES) ×6 IMPLANT
DRAPE U-SHAPE 47X51 STRL (DRAPES) ×3 IMPLANT
DRSG MEPITEL 4X7.2 (GAUZE/BANDAGES/DRESSINGS) ×3 IMPLANT
DRSG PAD ABDOMINAL 8X10 ST (GAUZE/BANDAGES/DRESSINGS) ×6 IMPLANT
DRSG VAC ATS MED SENSATRAC (GAUZE/BANDAGES/DRESSINGS) ×3 IMPLANT
DRSG VAC ATS SM SENSATRAC (GAUZE/BANDAGES/DRESSINGS) ×3 IMPLANT
GLOVE BIO SURGEON STRL SZ7 (GLOVE) ×6 IMPLANT
GLOVE BIO SURGEON STRL SZ7.5 (GLOVE) ×3 IMPLANT
GLOVE BIO SURGEON STRL SZ8 (GLOVE) ×3 IMPLANT
GLOVE BIOGEL PI IND STRL 7.5 (GLOVE) ×1 IMPLANT
GLOVE BIOGEL PI IND STRL 8 (GLOVE) ×1 IMPLANT
GLOVE BIOGEL PI INDICATOR 7.5 (GLOVE) ×2
GLOVE BIOGEL PI INDICATOR 8 (GLOVE) ×2
GLOVE SURG SS PI 6.5 STRL IVOR (GLOVE) ×3 IMPLANT
GLOVE SURG SS PI 8.0 STRL IVOR (GLOVE) ×6 IMPLANT
GOWN STRL REUS W/ TWL LRG LVL3 (GOWN DISPOSABLE) ×2 IMPLANT
GOWN STRL REUS W/ TWL XL LVL3 (GOWN DISPOSABLE) ×1 IMPLANT
GOWN STRL REUS W/TWL LRG LVL3 (GOWN DISPOSABLE) ×4
GOWN STRL REUS W/TWL XL LVL3 (GOWN DISPOSABLE) ×2
KIT PREVENA INCISION MGT 13 (CANNISTER) ×3 IMPLANT
KIT ROOM TURNOVER OR (KITS) ×3 IMPLANT
MANIFOLD NEPTUNE II (INSTRUMENTS) ×3 IMPLANT
NS IRRIG 1000ML POUR BTL (IV SOLUTION) ×3 IMPLANT
PACK GENERAL/GYN (CUSTOM PROCEDURE TRAY) ×3 IMPLANT
PAD ARMBOARD 7.5X6 YLW CONV (MISCELLANEOUS) ×6 IMPLANT
PAD CAST 4YDX4 CTTN HI CHSV (CAST SUPPLIES) ×3 IMPLANT
PAD NEG PRESSURE SENSATRAC (MISCELLANEOUS) IMPLANT
PADDING CAST COTTON 4X4 STRL (CAST SUPPLIES) ×6
SPLINT PLASTER CAST XFAST 5X30 (CAST SUPPLIES) ×1 IMPLANT
SPLINT PLASTER XFAST SET 5X30 (CAST SUPPLIES) ×2
SPONGE LAP 18X18 X RAY DECT (DISPOSABLE) IMPLANT
STAPLER VISISTAT 35W (STAPLE) IMPLANT
STOCKINETTE IMPERVIOUS LG (DRAPES) IMPLANT
SUT ETHILON 2 0 FS 18 (SUTURE) IMPLANT
SUT ETHILON 2 0 PSLX (SUTURE) ×6 IMPLANT
SUT PDS AB 0 CT 36 (SUTURE) ×3 IMPLANT
SUT PDS AB 2-0 CT1 27 (SUTURE) ×9 IMPLANT
SUT PROLENE 0 CT 1 30 (SUTURE) ×3 IMPLANT
SUT PROLENE 2 0 CT 1 (SUTURE) IMPLANT
SUT SILK 2 0 (SUTURE)
SUT SILK 2 0 SH CR/8 (SUTURE) IMPLANT
SUT SILK 2-0 18XBRD TIE 12 (SUTURE) IMPLANT
SUT VIC AB 0 CT1 27 (SUTURE) ×4
SUT VIC AB 0 CT1 27XBRD ANBCTR (SUTURE) ×2 IMPLANT
SUT VIC AB 2-0 CT1 27 (SUTURE) ×4
SUT VIC AB 2-0 CT1 TAPERPNT 27 (SUTURE) ×2 IMPLANT
SUT VICRYL AB 2 0 TIES (SUTURE) ×3 IMPLANT
SWAB COLLECTION DEVICE MRSA (MISCELLANEOUS) ×3 IMPLANT
TOWEL OR 17X24 6PK STRL BLUE (TOWEL DISPOSABLE) ×3 IMPLANT
TOWEL OR 17X26 10 PK STRL BLUE (TOWEL DISPOSABLE) ×6 IMPLANT
TUBE ANAEROBIC SPECIMEN COL (MISCELLANEOUS) ×3 IMPLANT
TUBING CYSTO DISP (UROLOGICAL SUPPLIES) ×3 IMPLANT
WATER STERILE IRR 1000ML POUR (IV SOLUTION) ×3 IMPLANT

## 2016-02-05 NOTE — Anesthesia Postprocedure Evaluation (Addendum)
Anesthesia Post Note  Patient: Darius [name]  Procedure(s) Performed: Procedure(s) (LRB): REVISION BELOW KNEE AMPUTATION LEFT  (Left) APPLICATION OF WOUND VAC (Left)  Patient location during evaluation: PACU Anesthesia Type: General Level of consciousness: awake and alert Pain management: pain level controlled (Additional interventions utilized. ) Vital Signs Assessment: post-procedure vital signs reviewed and stable Respiratory status: spontaneous breathing, nonlabored ventilation, respiratory function stable and patient connected to nasal cannula oxygen Cardiovascular status: blood pressure returned to baseline and stable Postop Assessment: no signs of nausea or vomiting Anesthetic complications: no    Last Vitals:  Filed Vitals:   02/05/16 1038 02/05/16 1045  BP: 159/75 157/104  Pulse:  116  Temp: 36.6 C   Resp:  13    Last Pain:  Currently comfortable and texting per RN.                Shelton SilvasKevin D Dorann Davidson

## 2016-02-05 NOTE — Anesthesia Preprocedure Evaluation (Signed)
Anesthesia Evaluation  Patient identified by MRN, date of birth, ID band Patient awake    Airway Mallampati: I  TM Distance: >3 FB Neck ROM: Full    Dental  (+) Teeth Intact   Pulmonary asthma , former smoker,    breath sounds clear to auscultation       Cardiovascular negative cardio ROS   Rhythm:Regular Rate:Normal     Neuro/Psych negative neurological ROS  negative psych ROS   GI/Hepatic negative GI ROS, Neg liver ROS,   Endo/Other  negative endocrine ROS  Renal/GU negative Renal ROS  negative genitourinary   Musculoskeletal negative musculoskeletal ROS (+)   Abdominal   Peds negative pediatric ROS (+)  Hematology   Anesthesia Other Findings   Reproductive/Obstetrics negative OB ROS                             Anesthesia Physical Anesthesia Plan  ASA: II  Anesthesia Plan: General   Post-op Pain Management:    Induction: Intravenous  Airway Management Planned: LMA  Additional Equipment:   Intra-op Plan:   Post-operative Plan: Extubation in OR  Informed Consent: I have reviewed the patients History and Physical, chart, labs and discussed the procedure including the risks, benefits and alternatives for the proposed anesthesia with the patient or authorized representative who has indicated his/her understanding and acceptance.   Dental advisory given  Plan Discussed with: CRNA  Anesthesia Plan Comments:         Anesthesia Quick Evaluation

## 2016-02-05 NOTE — Transfer of Care (Signed)
Immediate Anesthesia Transfer of Care Note  Patient: Darius [name]  Procedure(s) Performed: Procedure(s): REVISION BELOW KNEE AMPUTATION LEFT  (Left) APPLICATION OF WOUND VAC (Left)  Patient Location: PACU  Anesthesia Type:General  Level of Consciousness: awake, alert , oriented and patient cooperative  Airway & Oxygen Therapy: Patient Spontanous Breathing and Patient connected to nasal cannula oxygen  Post-op Assessment: Report given to RN and Post -op Vital signs reviewed and stable  Post vital signs: Reviewed and stable  Last Vitals:  Filed Vitals:   02/05/16 0706  BP: 132/76  Pulse: 79  Temp: 36.8 C  Resp: 18    Complications: No apparent anesthesia complications
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
## 2016-02-05 NOTE — Progress Notes (Signed)
Utilization review completed.  

## 2016-02-05 NOTE — Brief Op Note (Signed)
02/05/2016  2:16 PM  PATIENT:  Darius [name]  19 y.o. male  PRE-OPERATIVE DIAGNOSIS:  INFECTED BELOW KNEE AMPUTATION STUMP LEFT   POST-OPERATIVE DIAGNOSIS:  INFECTED BELOW KNEE AMPUTATION STUMP LEFT   PROCEDURE:  Procedure(s): REVISION BELOW KNEE AMPUTATION LEFT  (Left) APPLICATION OF WOUND VAC (Left)  SURGEON:  Surgeon(s) and Role:    * Myrene GalasMichael Charley Miske, MD - Primary  PHYSICIAN ASSISTANT: Montez MoritaKeith Paul, PA-C  ANESTHESIA:   general  I/O:  Total I/O In: 1560 [P.O.:360; I.V.:1200] Out: 125 [Blood:125]  SPECIMEN:  Source of Specimen:  deep tissue  TOURNIQUET:   Total Tourniquet Time Documented: Thigh (Left) - 41 minutes Thigh (Left) - 40 minutes Total: Thigh (Left) - 81 minutes   DICTATION: .[id number]

## 2016-02-06 ENCOUNTER — Encounter (HOSPITAL_COMMUNITY): Payer: Self-pay | Admitting: Orthopedic Surgery

## 2016-02-06 LAB — CBC
HCT: 35.5 % — ABNORMAL LOW (ref 39.0–52.0)
Hemoglobin: 11.2 g/dL — ABNORMAL LOW (ref 13.0–17.0)
MCH: 25.9 pg — AB (ref 26.0–34.0)
MCHC: 31.5 g/dL (ref 30.0–36.0)
MCV: 82.2 fL (ref 78.0–100.0)
PLATELETS: 283 10*3/uL (ref 150–400)
RBC: 4.32 MIL/uL (ref 4.22–5.81)
RDW: 14.5 % (ref 11.5–15.5)
WBC: 5.9 10*3/uL (ref 4.0–10.5)

## 2016-02-06 LAB — BASIC METABOLIC PANEL WITH GFR
Anion gap: 6 (ref 5–15)
BUN: 10 mg/dL (ref 6–20)
CO2: 28 mmol/L (ref 22–32)
Calcium: 9 mg/dL (ref 8.9–10.3)
Chloride: 104 mmol/L (ref 101–111)
Creatinine, Ser: 0.93 mg/dL (ref 0.61–1.24)
GFR calc Af Amer: >60 mL/min
GFR calc non Af Amer: >60 mL/min
Glucose, Bld: 99 mg/dL (ref 65–99)
Potassium: 4.1 mmol/L (ref 3.5–5.1)
Sodium: 138 mmol/L (ref 135–145)

## 2016-02-06 MED ORDER — DIPHENHYDRAMINE HCL 12.5 MG/5ML PO ELIX
12.5000 mg | ORAL_SOLUTION | Freq: Three times a day (TID) | ORAL | Status: DC | PRN
  Administered 2016-02-06: 25 mg via ORAL
  Filled 2016-02-06: qty 10

## 2016-02-06 MED ORDER — KETOROLAC TROMETHAMINE 10 MG PO TABS
10.0000 mg | ORAL_TABLET | Freq: Four times a day (QID) | ORAL | Status: DC
  Administered 2016-02-06 – 2016-02-07 (×4): 10 mg via ORAL
  Filled 2016-02-06 (×8): qty 1

## 2016-02-06 NOTE — Progress Notes (Signed)
OT Cancellation Note  Patient Details Name: Darius [name] MRN: [id number] DOB: 04/21/1996   Cancelled Treatment:    Reason Eval/Treat Not Completed: OT screened, no needs identified, will sign off. Per PT, pt with no acute OT needs identified. Please re-consult if needs change. Thank you for this referral.  Gaye AlkenBailey A Suhana Wilner M.S., OTR/L Pager: [phone]  02/06/2016, 1:57 PM
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
## 2016-02-07 ENCOUNTER — Encounter (HOSPITAL_COMMUNITY): Payer: Self-pay | Admitting: Orthopedic Surgery

## 2016-02-07 DIAGNOSIS — L089 Local infection of the skin and subcutaneous tissue, unspecified: Secondary | ICD-10-CM

## 2016-02-07 DIAGNOSIS — T148XXA Other injury of unspecified body region, initial encounter: Secondary | ICD-10-CM

## 2016-02-07 HISTORY — DX: Local infection of the skin and subcutaneous tissue, unspecified: L08.9

## 2016-02-07 LAB — CBC
HCT: 32.6 % — ABNORMAL LOW (ref 39.0–52.0)
Hemoglobin: 10.9 g/dL — ABNORMAL LOW (ref 13.0–17.0)
MCH: 27.2 pg (ref 26.0–34.0)
MCHC: 33.4 g/dL (ref 30.0–36.0)
MCV: 81.3 fL (ref 78.0–100.0)
PLATELETS: 251 10*3/uL (ref 150–400)
RBC: 4.01 MIL/uL — AB (ref 4.22–5.81)
RDW: 14.6 % (ref 11.5–15.5)
WBC: 7.8 10*3/uL (ref 4.0–10.5)

## 2016-02-07 MED ORDER — KETOROLAC TROMETHAMINE 10 MG PO TABS: 10.0000 mg | ORAL_TABLET | Freq: Four times a day (QID) | ORAL | Status: AC

## 2016-02-07 MED ORDER — LEVOFLOXACIN 750 MG PO TABS: 750.0000 mg | ORAL_TABLET | Freq: Every day | ORAL | Status: AC

## 2016-02-07 MED ORDER — OXYCODONE HCL 5 MG PO TABS: 5.0000 mg | ORAL_TABLET | Freq: Four times a day (QID) | ORAL | Status: AC | PRN

## 2016-02-07 MED ORDER — LEVOFLOXACIN 750 MG PO TABS: 750.0000 mg | ORAL_TABLET | Freq: Every day | ORAL | Status: DC

## 2016-02-07 MED ORDER — OXYCODONE-ACETAMINOPHEN 5-325 MG PO TABS: 1.0000 | ORAL_TABLET | Freq: Four times a day (QID) | ORAL | Status: AC | PRN

## 2016-02-07 MED ORDER — DOXYCYCLINE HYCLATE 100 MG PO TABS: 100.0000 mg | ORAL_TABLET | Freq: Two times a day (BID) | ORAL | Status: AC
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
## 2016-02-07 NOTE — Care Management Note (Signed)
Case Management Note  Patient Details  Name: Darius [name] MRN: [id number] Date of Birth: 08/23/1996  Subjective/Objective:               S/p revision left BKA     Action/Plan: No therapy or equipment needs identified by PT. Spoke with patient about discharge plan, no therapy or equipment needs identified. Patient stated that his parents will be able to assist him after discharge. Will continue to follow for discharge needs.     Expected Discharge Date:                  Expected Discharge Plan:  Home/Self Care  In-House Referral:  NA  Discharge planning Services  CM Consult  Post Acute Care Choice:  NA Choice offered to:  NA  DME Arranged:    DME Agency:     HH Arranged:    HH Agency:     Status of Service:  In process, will continue to follow  Medicare Important Message Given:    Date Medicare IM Given:    Medicare IM give by:    Date Additional Medicare IM Given:    Additional Medicare Important Message give by:     If discussed at Long Length of Stay Meetings, dates discussed:    Additional Comments:  Monica BectonKrieg, Darius Dobies Watson, RN 02/07/2016, 8:49 AM

## 2016-02-07 NOTE — Discharge Instructions (Signed)
Orthopaedic Trauma Service Discharge Instructions   General Discharge Instructions  WEIGHT BEARING STATUS: Nonweightbearing   RANGE OF MOTION/ACTIVITY: no range of motion of knee as you are in a splint. Activity as tolerated   Wound Care: No wound care to be done until first office visit. Keep splint and dressing clean and dry   PAIN MEDICATION USE AND EXPECTATIONS  You have likely been given narcotic medications to help control your pain.  After a traumatic event that results in an fracture (broken bone) with or without surgery, it is ok to use narcotic pain medications to help control one's pain.  We understand that everyone responds to pain differently and each individual patient will be evaluated on a regular basis for the continued need for narcotic medications. Ideally, narcotic medication use should last no more than 6-8 weeks (coinciding with fracture healing).   As a patient it is your responsibility as well to monitor narcotic medication use and report the amount and frequency you use these medications when you come to your office visit.   We would also advise that if you are using narcotic medications, you should take a dose prior to therapy to maximize you participation.  IF YOU ARE ON NARCOTIC MEDICATIONS IT IS NOT PERMISSIBLE TO OPERATE A MOTOR VEHICLE (MOTORCYCLE/CAR/TRUCK/MOPED) OR HEAVY MACHINERY DO NOT MIX NARCOTICS WITH OTHER CNS (CENTRAL NERVOUS SYSTEM) DEPRESSANTS SUCH AS ALCOHOL  Diet: as you were eating previously.  Can use over the counter stool softeners and bowel preparations, such as Miralax, to help with bowel movements.  Narcotics can be constipating.  Be sure to drink plenty of fluids    STOP SMOKING OR USING NICOTINE PRODUCTS!!!!  As discussed nicotine severely impairs your body's ability to heal surgical and traumatic wounds but also impairs bone healing.  Wounds and bone heal by forming microscopic blood vessels (angiogenesis) and nicotine is a vasoconstrictor  (essentially, shrinks blood vessels).  Therefore, if vasoconstriction occurs to these microscopic blood vessels they essentially disappear and are unable to deliver necessary nutrients to the healing tissue.  This is one modifiable factor that you can do to dramatically increase your chances of healing your injury.    (This means no smoking, no nicotine gum, patches, etc)  DO NOT USE NONSTEROIDAL ANTI-INFLAMMATORY DRUGS (NSAID'S)  Using products such as Advil (ibuprofen), Aleve (naproxen), Motrin (ibuprofen) for additional pain control during fracture healing can delay and/or prevent the healing response.  If you would like to take over the counter (OTC) medication, Tylenol (acetaminophen) is ok.  However, some narcotic medications that are given for pain control contain acetaminophen as well. Therefore, you should not exceed more than 4000 mg of tylenol in a day if you do not have liver disease.  Also note that there are may OTC medicines, such as cold medicines and allergy medicines that my contain tylenol as well.  If you have any questions about medications and/or interactions please ask your doctor/PA or your pharmacist.      ICE AND ELEVATE INJURED/OPERATIVE EXTREMITY  Using ice and elevating the injured extremity above your heart can help with swelling and pain control.  Icing in a pulsatile fashion, such as 20 minutes on and 20 minutes off, can be followed.    Do not place ice directly on skin. Make sure there is a barrier between to skin and the ice pack.    Using frozen items such as frozen peas works well as the conform nicely to the are that needs to be iced.  USE AN ACE WRAP OR TED HOSE FOR SWELLING CONTROL  In addition to icing and elevation, Ace wraps or TED hose are used to help limit and resolve swelling.  It is recommended to use Ace wraps or TED hose until you are informed to stop.    When using Ace Wraps start the wrapping distally (farthest away from the body) and wrap proximally  (closer to the body)   Example: If you had surgery on your leg or thing and you do not have a splint on, start the ace wrap at the toes and work your way up to the thigh        If you had surgery on your upper extremity and do not have a splint on, start the ace wrap at your fingers and work your way up to the upper arm  IF YOU ARE IN A SPLINT OR CAST DO NOT REMOVE IT FOR ANY REASON   If your splint gets wet for any reason please contact the office immediately. You may shower in your splint or cast as long as you keep it dry.  This can be done by wrapping in a cast cover or garbage back (or similar)  Do Not stick any thing down your splint or cast such as pencils, money, or hangers to try and scratch yourself with.  If you feel itchy take benadryl as prescribed on the bottle for itching  IF YOU ARE IN A CAM BOOT (BLACK BOOT)  You may remove boot periodically. Perform daily dressing changes as noted below.  Wash the liner of the boot regularly and wear a sock when wearing the boot. It is recommended that you sleep in the boot until told otherwise  CALL THE OFFICE WITH ANY QUESTIONS OR CONCERTS: [phone]

## 2016-02-07 NOTE — Discharge Summary (Signed)
Orthopaedic Trauma Service (OTS)  Patient ID: Darius [name] MRN: [id number] DOB/AGE: 22-22-1997 22 y.o.  Admit date: 02/05/2016 Discharge date: 02/07/2016  Admission Diagnoses: Infected left BKA  Discharge Diagnoses:  Principal Problem:   Wound infection [facility]), Left BKA    Procedures Performed: 02/05/2016- Dr. Marcelino Scot  1. Revision of left below-knee amputation stump, free cutting of the     tibia and fibula. 2. Application of wound VAC   Discharged Condition: good  Hospital Course:   20 year old male well-known to the orthopedic trauma service after sustaining a traumatic crush injury to his left foot December 2016. Patient ultimately underwent left BKA. Patient did well perioperatively prior to his discharge from the hospital all dressings were changed and he had stable appearance to his left BKA. He subsequently went home to [place] for the holidays and had wound complications. This necessitated returning to the OR several times with a vascular surgeon in [place]. Patient has had persistent issues with wound healing to his left BKA. Patient has been seen in our office several times since his return back to [place] and we decided to take him to the operating room for revision of his left BKA. Procedure was performed on 02/05/2016. Patient tolerated procedure well. There were no complications. Sedimentation rate and CRP were checked preoperatively and these were normal. Cultures were sent intraoperatively and show staph aureus.. Patient was covered with Levaquin during his hospital stay given the good bone penetration. For discharge we will send him home with oral Levaquin 750 mg daily 30 days as well as doxycycline 100 mg every 12 hours for 30 days. We will follow up on final cultures and sensitivities and adjust antibiotics accordingly. Patient's hospital stay was uncomplicated and on postoperative day #1 he was mobilize very well with physical therapy. No acute concerns were  noted. Patient was deemed stable for discharge on postoperative day #2. His home wound VAC unit was hooked up prior to his discharge. Patient was kept in a splint maintaining full knee extension for follow-up in the office on 02/11/2016. At the time of discharge patient had adequate pain control with oral pain medications, he was mobilizing very well independently. He was tolerating diet and voiding without difficulty  Consults: None  Significant Diagnostic Studies: labs:  Results for PHIL, MICHELS (MRN [id number]) as of 02/07/2016 14:05  Ref. Range 02/06/2016 05:00  Sodium Latest Ref Range: 135-145 mmol/L 138  Potassium Latest Ref Range: 3.5-5.1 mmol/L 4.1  Chloride Latest Ref Range: 101-111 mmol/L 104  CO2 Latest Ref Range: 22-32 mmol/L 28  BUN Latest Ref Range: 6-20 mg/dL 10  Creatinine Latest Ref Range: 0.61-1.24 mg/dL 0.93  Calcium Latest Ref Range: 8.9-10.3 mg/dL 9.0  EGFR (Non-African Amer.) Latest Ref Range: >60 mL/min >60  EGFR (African American) Latest Ref Range: >60 mL/min >60  Glucose Latest Ref Range: 65-99 mg/dL 99  Anion gap Latest Ref Range: 5-15  6  Results for TREVIAN, HAYASHIDA (MRN [id number]) as of 02/07/2016 14:05  Ref. Range 02/07/2016 04:34  WBC Latest Ref Range: 4.0-10.5 K/uL 7.8  RBC Latest Ref Range: 4.22-5.81 MIL/uL 4.01 (L)  Hemoglobin Latest Ref Range: 13.0-17.0 g/dL 10.9 (L)  HCT Latest Ref Range: 39.0-52.0 % 32.6 (L)  MCV Latest Ref Range: 78.0-100.0 fL 81.3  MCH Latest Ref Range: 26.0-34.0 pg 27.2  MCHC Latest Ref Range: 30.0-36.0 g/dL 33.4  RDW Latest Ref Range: 11.5-15.5 % 14.6  Platelets Latest Ref Range: 150-400 K/uL 251   Results  for MARCQUES, WRIGHTSMAN (MRN [id number]) as of 02/07/2016 14:05  Ref. Range 02/05/2016 22:08  Amphetamines Latest Ref Range: NONE DETECTED  NONE DETECTED  Barbiturates Latest Ref Range: NONE DETECTED  NONE DETECTED  Benzodiazepines Latest Ref Range: NONE DETECTED  POSITIVE (A)  Opiates Latest Ref Range: NONE DETECTED  POSITIVE (A)  COCAINE Latest  Ref Range: NONE DETECTED  NONE DETECTED  Tetrahydrocannabinol Latest Ref Range: NONE DETECTED  POSITIVE (A)   Results for JAKORI, BURKETT (MRN [id number]) as of 02/07/2016 14:05  Ref. Range 02/05/2016 07:24  CRP Latest Ref Range: <1.0 mg/dL 0.5  Results for NICOLAOS, MITRANO (MRN [id number]) as of 02/07/2016 14:05  Ref. Range 02/05/2016 07:24  Sed Rate Latest Ref Range: 0-16 mm/hr 2   Treatments: IV hydration, antibiotics: Levaquin, analgesia: Percocet, OxyIR, Dilaudid, Toradol, anticoagulation: LMW heparin, therapies: PT and RN and surgery: as above   Discharge Exam:          Orthopaedic Trauma Service Progress Note  Subjective  Doing great Ready to go home   ROS  As above Objective     BP 114/45 mmHg  Pulse 83  Temp(Src) 99.1 F (37.3 C) (Oral)  Resp 18  Ht 6' (1.829 m)  Wt 70.308 kg (155 lb)  BMI 21.02 kg/m2  SpO2 100%  Intake/Output       03/08 0701 - 03/09 0700 03/09 0701 - 03/10 0700    P.O. 960     I.V. (mL/kg)      Total Intake(mL/kg) 960 (13.7)     Urine (mL/kg/hr) 300 (0.2)     Drains 25 (0)     Blood      Total Output 325      Net +635              Labs Results for GERAL, COKER (MRN [id number]) as of 02/07/2016 13:44   Ref. Range  02/05/2016 07:24   CRP  Latest Ref Range: <1.0 mg/dL  0.5    Results for ANANIAS, KOLANDER (MRN [id number]) as of 02/07/2016 13:44   Ref. Range  02/05/2016 07:24   Sed Rate  Latest Ref Range: 0-16 mm/hr  2   Results for TYKE, OUTMAN (MRN [id number]) as of 02/07/2016 13:44   Ref. Range  02/07/2016 04:34   WBC  Latest Ref Range: 4.0-10.5 K/uL  7.8   RBC  Latest Ref Range: 4.22-5.81 MIL/uL  4.01 (L)   Hemoglobin  Latest Ref Range: 13.0-17.0 g/dL  10.9 (L)   HCT  Latest Ref Range: 39.0-52.0 %  32.6 (L)   MCV  Latest Ref Range: 78.0-100.0 fL  81.3   MCH  Latest Ref Range: 26.0-34.0 pg  27.2   MCHC  Latest Ref Range: 30.0-36.0 g/dL  33.4   RDW  Latest Ref Range: 11.5-15.5 %  14.6   Platelets  Latest Ref Range: 150-400 K/uL  251    Tissue culture   Status: Preliminary result     Visible to patient:  Not Released     Next appt: None              2d ago     Specimen Description TISSUE KNEE LEFT    Special Requests BKA STUMP    Gram Stain RARE WBC PRESENT, PREDOMINANTLY PMN  NO ORGANISMS SEEN  Performed at [facility]        Culture FEW STAPHYLOCOCCUS AUREUS  Note: RIFAMPIN AND GENTAMICIN SHOULD NOT BE USED AS SINGLE DRUGS FOR TREATMENT OF STAPH INFECTIONS.  Performed at [facility]        Specimen Collected: 02/05/16 9:11 AM Last Resulted: 02/07/16 12:01 PM                Exam  Gen: awake, alert, in good spirits, comfortable appearing   Ext:        Left Lower Extremity               Vac functioning             No drainage in canister             Dressing/splint stable       Assessment and Plan    POD/HD#: 2  20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    -L BKA revision s/p revision               Continue with vac and splint             Dc home             Follow up 02/11/2016             Continue with ice and elevation             Sutures out in 2-3 weeks             Re-measure for prosthesis in 2-3 weeks             Likely start using prosthesis in 4-6 weeks   - Pain management:             Minimize narcotics            toradol for another 4 days   - Hemodynamics             Stable   - DVT/PE prophylaxis:             no pharmacologics at dc  - ID:               CRP and ESR normal              Dc on PO levaquin x 30 days  Doxycycline 100 mg po BID x 30 days   Follow up on final C&S   - Metabolic Bone Disease:             vitamin d levels pending   - Activity:             Up as tolerated   - FEN/GI prophylaxis/Foley/Lines:             Diet as tolerated             protonix             dc IV   - Dispo:             Therapies               Dc home               Follow up on Monday    Jari Pigg,  PA-C [facility] [phone] (O) 02/07/2016 1:44 PM   Disposition: 01-Home or Self Care      Discharge Instructions    Call MD / Call 911    Complete by:  As directed   If you experience chest pain or shortness  of breath, CALL 911 and be transported to the hospital emergency room.  If you develope a fever above 101 F, pus (white drainage) or increased drainage or redness at the wound, or calf pain, call your surgeon's office.     Constipation Prevention    Complete by:  As directed   Drink plenty of fluids.  Prune juice may be helpful.  You may use a stool softener, such as Colace (over the counter) 100 mg twice a day.  Use MiraLax (over the counter) for constipation as needed.     Diet general    Complete by:  As directed      Discharge instructions    Complete by:  As directed   Orthopaedic Trauma Service Discharge Instructions   General Discharge Instructions  WEIGHT BEARING STATUS: Nonweightbearing   RANGE OF MOTION/ACTIVITY: no range of motion of knee as you are in a splint. Activity as tolerated   Wound Care: No wound care to be done until first office visit. Keep splint and dressing clean and dry   PAIN MEDICATION USE AND EXPECTATIONS  You have likely been given narcotic medications to help control your pain.  After a traumatic event that results in an fracture (broken bone) with or without surgery, it is ok to use narcotic pain medications to help control one's pain.  We understand that everyone responds to pain differently and each individual patient will be evaluated on a regular basis for the continued need for narcotic medications. Ideally, narcotic medication use should last no more than 6-8 weeks (coinciding with fracture healing).   As a patient it is your responsibility as well to monitor narcotic medication use and report the amount and frequency you use these medications when you come to your office visit.   We would also advise that if  you are using narcotic medications, you should take a dose prior to therapy to maximize you participation.  IF YOU ARE ON NARCOTIC MEDICATIONS IT IS NOT PERMISSIBLE TO OPERATE A MOTOR VEHICLE (MOTORCYCLE/CAR/TRUCK/MOPED) OR HEAVY MACHINERY DO NOT MIX NARCOTICS WITH OTHER CNS (CENTRAL NERVOUS SYSTEM) DEPRESSANTS SUCH AS ALCOHOL  Diet: as you were eating previously.  Can use over the counter stool softeners and bowel preparations, such as Miralax, to help with bowel movements.  Narcotics can be constipating.  Be sure to drink plenty of fluids    STOP SMOKING OR USING NICOTINE PRODUCTS!!!!  As discussed nicotine severely impairs your body's ability to heal surgical and traumatic wounds but also impairs bone healing.  Wounds and bone heal by forming microscopic blood vessels (angiogenesis) and nicotine is a vasoconstrictor (essentially, shrinks blood vessels).  Therefore, if vasoconstriction occurs to these microscopic blood vessels they essentially disappear and are unable to deliver necessary nutrients to the healing tissue.  This is one modifiable factor that you can do to dramatically increase your chances of healing your injury.    (This means no smoking, no nicotine gum, patches, etc)  DO NOT USE NONSTEROIDAL ANTI-INFLAMMATORY DRUGS (NSAID'S)  Using products such as Advil (ibuprofen), Aleve (naproxen), Motrin (ibuprofen) for additional pain control during fracture healing can delay and/or prevent the healing response.  If you would like to take over the counter (OTC) medication, Tylenol (acetaminophen) is ok.  However, some narcotic medications that are given for pain control contain acetaminophen as well. Therefore, you should not exceed more than 4000 mg of tylenol in a day if you do not have liver disease.  Also note that there are may OTC  medicines, such as cold medicines and allergy medicines that my contain tylenol as well.  If you have any questions about medications and/or interactions please  ask your doctor/PA or your pharmacist.      ICE AND ELEVATE INJURED/OPERATIVE EXTREMITY  Using ice and elevating the injured extremity above your heart can help with swelling and pain control.  Icing in a pulsatile fashion, such as 20 minutes on and 20 minutes off, can be followed.    Do not place ice directly on skin. Make sure there is a barrier between to skin and the ice pack.    Using frozen items such as frozen peas works well as the conform nicely to the are that needs to be iced.  USE AN ACE WRAP OR TED HOSE FOR SWELLING CONTROL  In addition to icing and elevation, Ace wraps or TED hose are used to help limit and resolve swelling.  It is recommended to use Ace wraps or TED hose until you are informed to stop.    When using Ace Wraps start the wrapping distally (farthest away from the body) and wrap proximally (closer to the body)   Example: If you had surgery on your leg or thing and you do not have a splint on, start the ace wrap at the toes and work your way up to the thigh        If you had surgery on your upper extremity and do not have a splint on, start the ace wrap at your fingers and work your way up to the upper arm  IF YOU ARE IN A SPLINT OR CAST DO NOT [place]   If your splint gets wet for any reason please contact the office immediately. You may shower in your splint or cast as long as you keep it dry.  This can be done by wrapping in a cast cover or garbage back (or similar)  Do Not stick any thing down your splint or cast such as pencils, money, or hangers to try and scratch yourself with.  If you feel itchy take benadryl as prescribed on the bottle for itching  IF YOU ARE IN A CAM BOOT (BLACK BOOT)  You may remove boot periodically. Perform daily dressing changes as noted below.  Wash the liner of the boot regularly and wear a sock when wearing the boot. It is recommended that you sleep in the boot until told otherwise  CALL THE OFFICE WITH ANY QUESTIONS  OR CONCERTS: [phone]     Driving restrictions    Complete by:  As directed   No driving     Increase activity slowly as tolerated    Complete by:  As directed             Medication List    STOP taking these medications        cyclobenzaprine 5 MG tablet  Commonly known as:  FLEXERIL     docusate sodium 100 MG capsule  Commonly known as:  COLACE     enoxaparin 40 MG/0.4ML injection  Commonly known as:  LOVENOX     pregabalin 100 MG capsule  Commonly known as:  LYRICA      TAKE these medications        ketorolac 10 MG tablet  Commonly known as:  TORADOL  Take 1 tablet (10 mg total) by mouth every 6 (six) hours.     levofloxacin 750 MG tablet  Commonly known as:  LEVAQUIN  Take  1 tablet (750 mg total) by mouth daily.     oxyCODONE 5 MG immediate release tablet  Commonly known as:  Oxy IR/ROXICODONE  Take 1-2 tablets (5-10 mg total) by mouth every 6 (six) hours as needed for breakthrough pain (take between perococet for breakthrough pain).     oxyCODONE-acetaminophen 5-325 MG tablet  Commonly known as:  PERCOCET/ROXICET  Take 1-2 tablets by mouth every 6 (six) hours as needed for moderate pain or severe pain.       Follow-up Information    Follow up with Rozanna Box, MD. Schedule an appointment as soon as possible for a visit on 02/11/2016.   Specialty:  Orthopedic Surgery   Why:  For wound re-check   Contact information:   [address] [place] [phone]       Discharge Instructions and Plan:  20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    -L BKA revision s/p revision               Continue with vac and splint             Dc home             Follow up 02/11/2016             Continue with ice and elevation             Sutures out in 2-3 weeks             Re-measure for prosthesis in 2-3 weeks             Likely start using prosthesis in 4-6 weeks   - Pain management:             Minimize narcotics             toradol for another 4 days   - Hemodynamics             Stable   - DVT/PE prophylaxis:             no pharmacologics at dc  - ID:               CRP and ESR normal              Dc on PO levaquin x 30 days  Doxycycline 100 mg po BID x 30 days   Follow up on final C&S  - Metabolic Bone Disease:             vitamin d levels pending   - Activity:             Up as tolerated   - FEN/GI prophylaxis/Foley/Lines:             Diet as tolerated             protonix             dc IV   - Dispo:             Therapies               Dc home               Follow up on Monday    Signed:  Jari Pigg, PA-C [facility] [phone] (P) 02/07/2016, 2:02 PM

## 2016-02-08 LAB — TISSUE CULTURE

## 2016-02-08 LAB — VITAMIN D 25 HYDROXY (VIT D DEFICIENCY, FRACTURES): VIT D 25 HYDROXY: 32.3 ng/mL (ref 30.0–100.0)

## 2016-02-12 LAB — NICOTINE/COTININE METABOLITES
Cotinine: NOT DETECTED ng/mL
NICOTINE: NOT DETECTED ng/mL

## 2016-03-19 ENCOUNTER — Ambulatory Visit: Payer: 59 | Attending: Orthopedic Surgery | Admitting: Physical Therapy

## 2016-03-19 DIAGNOSIS — R29898 Other symptoms and signs involving the musculoskeletal system: Secondary | ICD-10-CM | POA: Insufficient documentation

## 2016-03-19 DIAGNOSIS — R2689 Other abnormalities of gait and mobility: Secondary | ICD-10-CM | POA: Insufficient documentation

## 2016-03-19 DIAGNOSIS — R2681 Unsteadiness on feet: Secondary | ICD-10-CM | POA: Insufficient documentation

## 2016-03-19 DIAGNOSIS — M79662 Pain in left lower leg: Secondary | ICD-10-CM | POA: Insufficient documentation

## 2016-03-25 ENCOUNTER — Ambulatory Visit: Payer: 59 | Admitting: Physical Therapy

## 2016-03-25 ENCOUNTER — Encounter: Payer: Self-pay | Admitting: Physical Therapy

## 2016-03-25 DIAGNOSIS — R2681 Unsteadiness on feet: Secondary | ICD-10-CM

## 2016-03-25 DIAGNOSIS — R29898 Other symptoms and signs involving the musculoskeletal system: Secondary | ICD-10-CM | POA: Diagnosis present

## 2016-03-25 DIAGNOSIS — M79662 Pain in left lower leg: Secondary | ICD-10-CM | POA: Diagnosis present

## 2016-03-25 DIAGNOSIS — R2689 Other abnormalities of gait and mobility: Secondary | ICD-10-CM

## 2016-03-26 ENCOUNTER — Ambulatory Visit: Payer: 59 | Admitting: Physical Therapy

## 2016-03-26 ENCOUNTER — Encounter: Payer: Self-pay | Admitting: Physical Therapy

## 2016-03-26 DIAGNOSIS — M79662 Pain in left lower leg: Secondary | ICD-10-CM

## 2016-03-26 DIAGNOSIS — R2681 Unsteadiness on feet: Secondary | ICD-10-CM

## 2016-03-26 DIAGNOSIS — R29898 Other symptoms and signs involving the musculoskeletal system: Secondary | ICD-10-CM

## 2016-03-26 DIAGNOSIS — R2689 Other abnormalities of gait and mobility: Secondary | ICD-10-CM
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
## 2016-04-01 ENCOUNTER — Ambulatory Visit: Payer: 59 | Attending: Orthopedic Surgery | Admitting: Physical Therapy

## 2016-04-01 ENCOUNTER — Encounter: Payer: Self-pay | Admitting: Physical Therapy

## 2016-04-01 DIAGNOSIS — R29898 Other symptoms and signs involving the musculoskeletal system: Secondary | ICD-10-CM | POA: Diagnosis present

## 2016-04-01 DIAGNOSIS — R2689 Other abnormalities of gait and mobility: Secondary | ICD-10-CM | POA: Diagnosis not present

## 2016-04-01 DIAGNOSIS — M79662 Pain in left lower leg: Secondary | ICD-10-CM | POA: Diagnosis present

## 2016-04-01 DIAGNOSIS — R2681 Unsteadiness on feet: Secondary | ICD-10-CM | POA: Insufficient documentation
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
## 2016-04-03 ENCOUNTER — Encounter: Payer: Self-pay | Admitting: Physical Therapy

## 2016-04-03 ENCOUNTER — Ambulatory Visit: Payer: 59 | Admitting: Physical Therapy

## 2016-04-03 DIAGNOSIS — R29898 Other symptoms and signs involving the musculoskeletal system: Secondary | ICD-10-CM

## 2016-04-03 DIAGNOSIS — R2681 Unsteadiness on feet: Secondary | ICD-10-CM

## 2016-04-03 DIAGNOSIS — R2689 Other abnormalities of gait and mobility: Secondary | ICD-10-CM

## 2016-04-03 DIAGNOSIS — M79662 Pain in left lower leg: Secondary | ICD-10-CM
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
## 2016-04-08 ENCOUNTER — Ambulatory Visit: Payer: 59 | Admitting: Physical Therapy

## 2016-04-08 ENCOUNTER — Encounter: Payer: Self-pay | Admitting: Physical Therapy

## 2016-04-08 DIAGNOSIS — R29898 Other symptoms and signs involving the musculoskeletal system: Secondary | ICD-10-CM

## 2016-04-08 DIAGNOSIS — R2689 Other abnormalities of gait and mobility: Secondary | ICD-10-CM

## 2016-04-08 DIAGNOSIS — R2681 Unsteadiness on feet: Secondary | ICD-10-CM

## 2016-04-08 DIAGNOSIS — M79662 Pain in left lower leg: Secondary | ICD-10-CM
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
## 2016-04-10 ENCOUNTER — Ambulatory Visit: Payer: 59 | Admitting: Physical Therapy

## 2016-04-10 DIAGNOSIS — R2681 Unsteadiness on feet: Secondary | ICD-10-CM

## 2016-04-10 DIAGNOSIS — R2689 Other abnormalities of gait and mobility: Secondary | ICD-10-CM | POA: Diagnosis not present

## 2016-04-10 DIAGNOSIS — R29898 Other symptoms and signs involving the musculoskeletal system: Secondary | ICD-10-CM
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
## 2016-04-14 ENCOUNTER — Encounter: Payer: Self-pay | Admitting: Physical Therapy

## 2016-04-14 ENCOUNTER — Ambulatory Visit: Payer: 59 | Admitting: Physical Therapy

## 2016-04-14 DIAGNOSIS — R29898 Other symptoms and signs involving the musculoskeletal system: Secondary | ICD-10-CM

## 2016-04-14 DIAGNOSIS — R2689 Other abnormalities of gait and mobility: Secondary | ICD-10-CM

## 2016-04-14 DIAGNOSIS — R2681 Unsteadiness on feet: Secondary | ICD-10-CM

## 2016-04-14 DIAGNOSIS — M79662 Pain in left lower leg: Secondary | ICD-10-CM
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
## 2016-04-16 ENCOUNTER — Ambulatory Visit: Payer: 59 | Admitting: Physical Therapy

## 2016-04-17 ENCOUNTER — Ambulatory Visit: Payer: 59 | Admitting: Physical Therapy

## 2016-04-17 DIAGNOSIS — R2689 Other abnormalities of gait and mobility: Secondary | ICD-10-CM

## 2016-04-17 DIAGNOSIS — R29898 Other symptoms and signs involving the musculoskeletal system: Secondary | ICD-10-CM

## 2016-04-17 DIAGNOSIS — R2681 Unsteadiness on feet: Secondary | ICD-10-CM
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
## 2017-01-08 IMAGING — CR DG FOOT 2V*L*
2 series · 2 of 2 positions shown · non-contrast
Comparison: Study obtained earlier in the day

CLINICAL DATA: Status post amputation

EXAM:
LEFT FOOT - 2 VIEW

[AP]
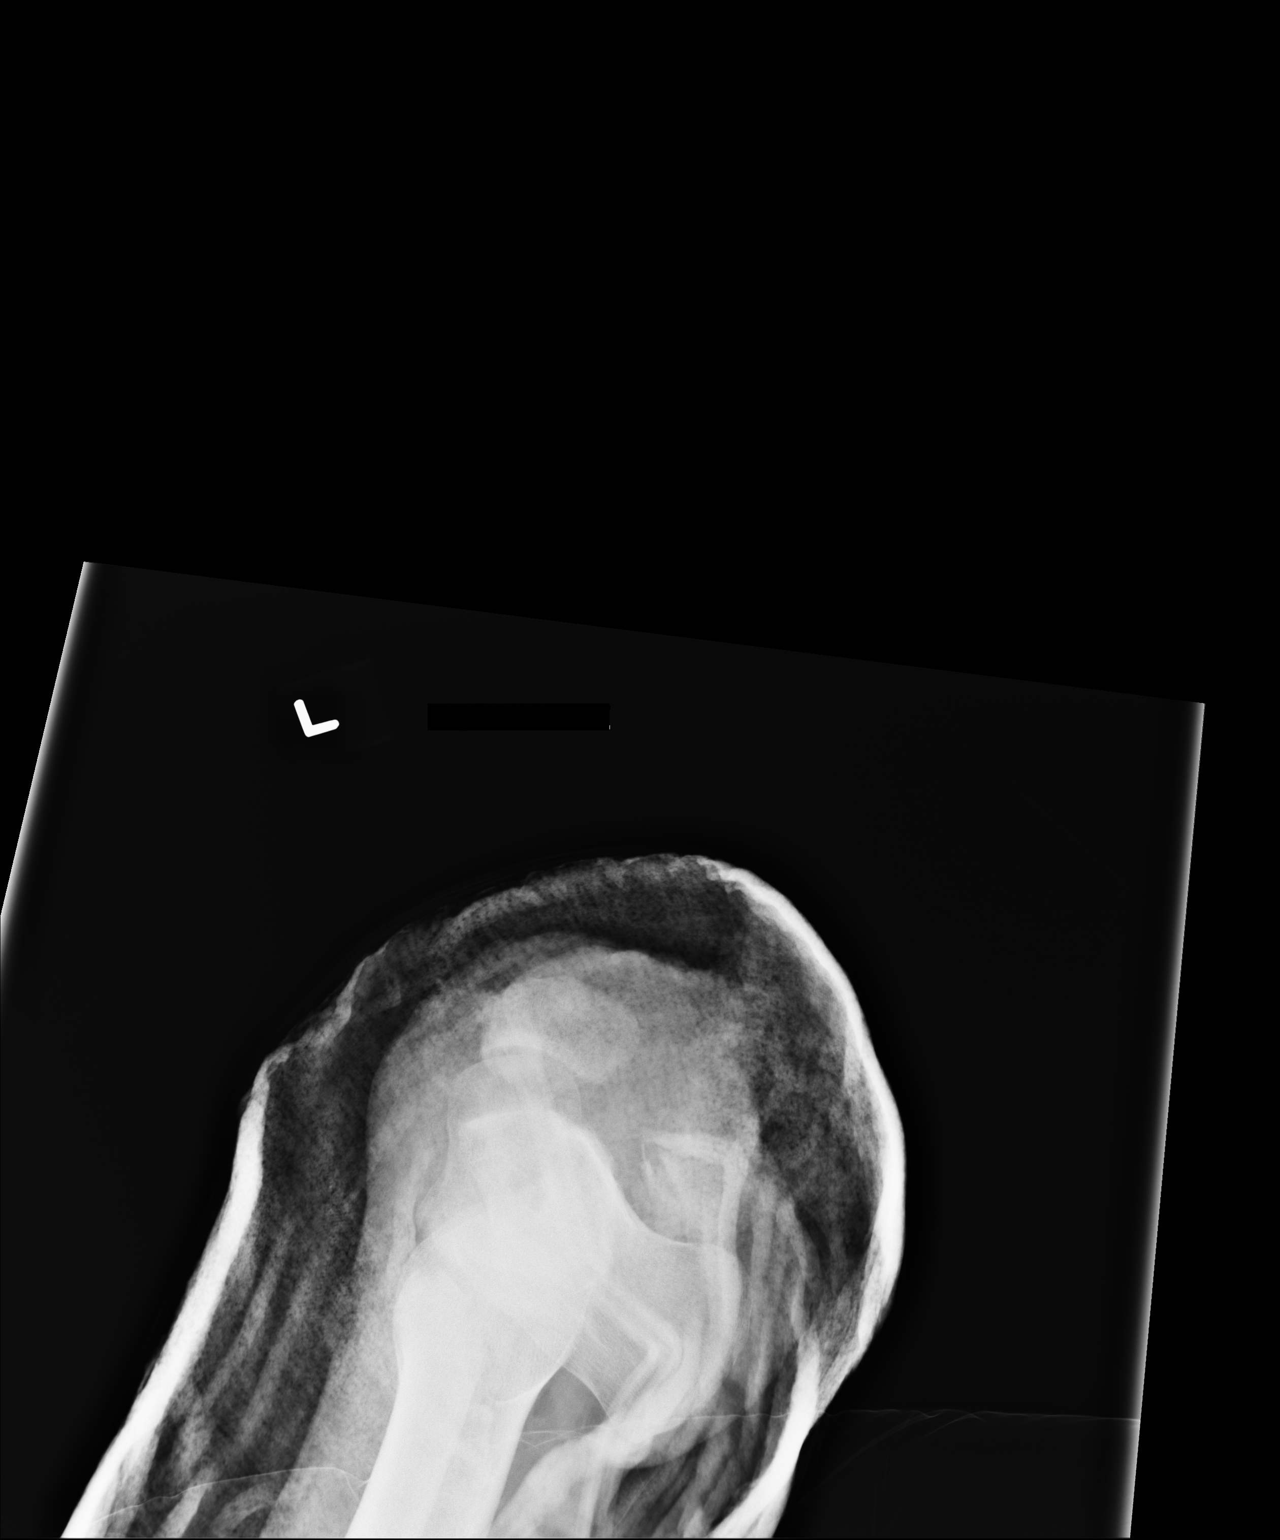

[lateral]
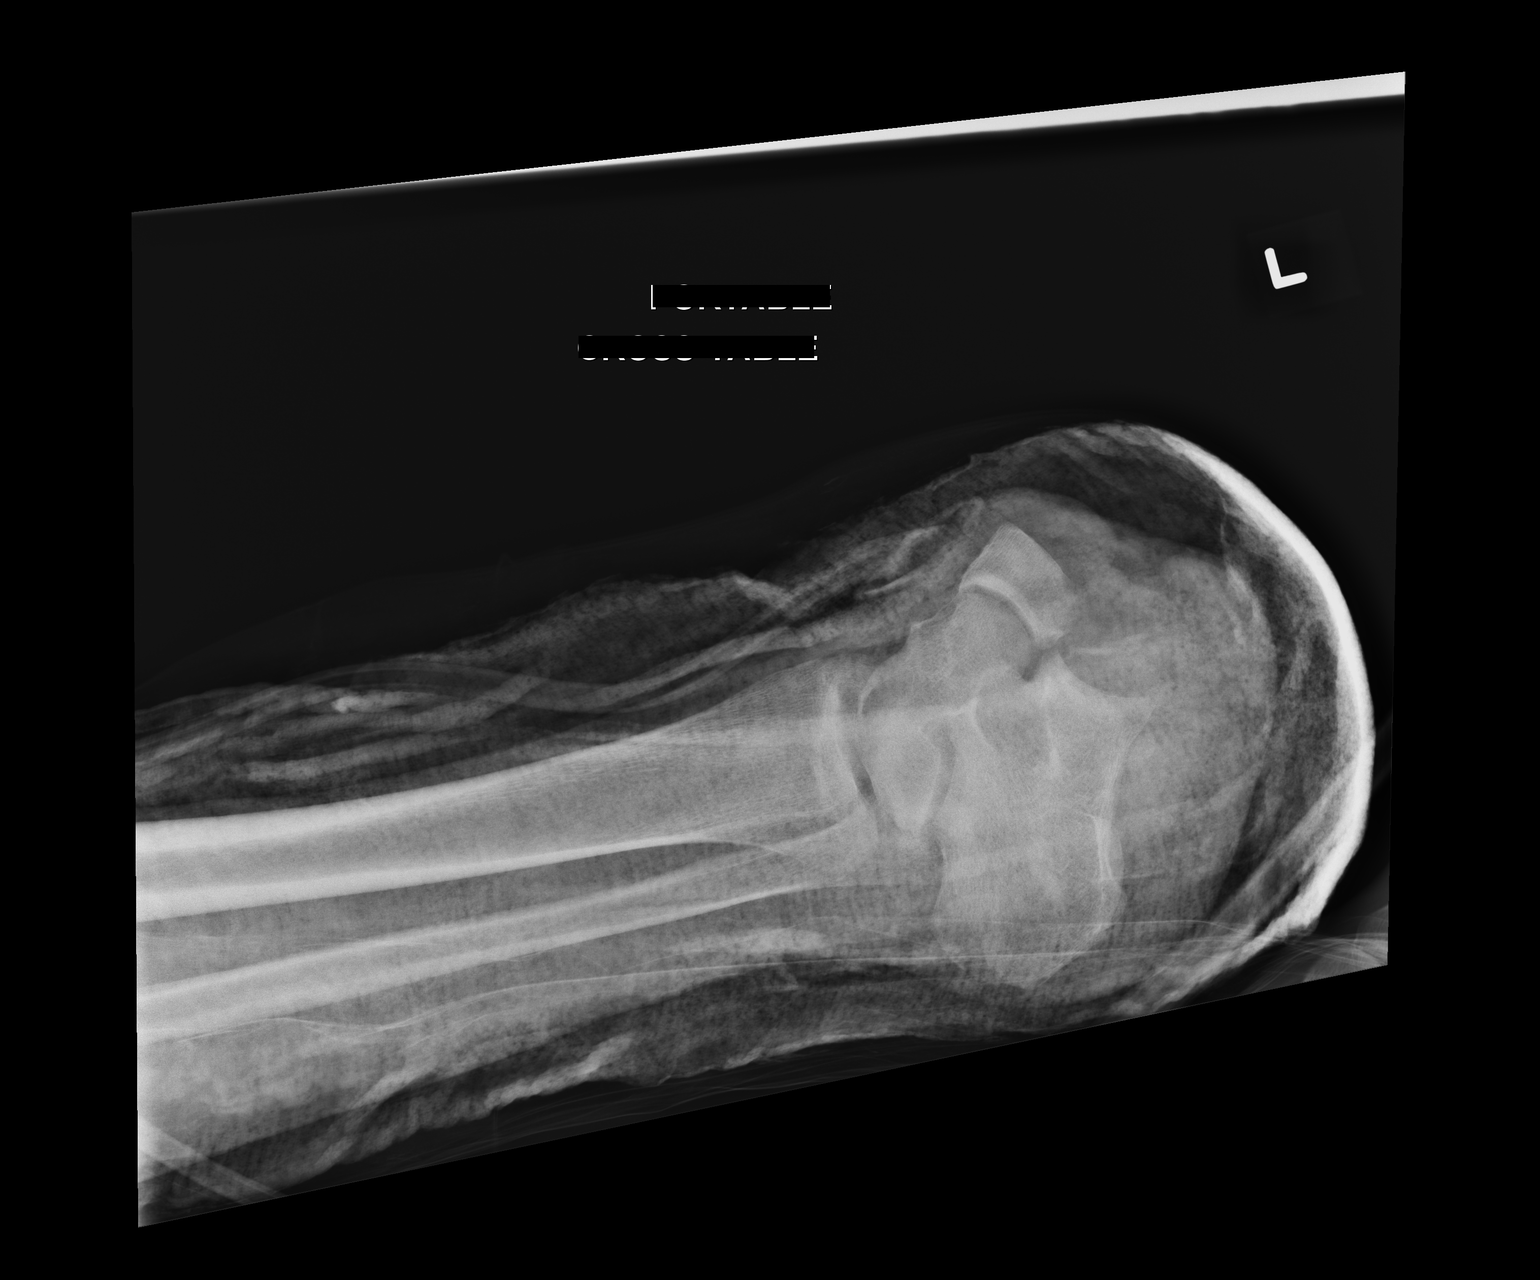

[2 of 2 positions shown; findings below may reference images not displayed]

FINDINGS: Frontal and lateral views show the remaining left foot in plaster.
There is been amputation of the foot distal to the navicular and
calcaneus bones. Remaining bones appear intact. No dislocation in
remaining bony structures. No erosive change or bony destruction
evident. In plaster images preclude further anatomic delineation.
IMPRESSION: Status post amputation at the level beyond the calcaneus and
navicular bones. Remain bones appear intact.
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
